# Patient Record
Sex: Male | Born: 1965 | Race: Asian | Hispanic: No | Marital: Married | Smoking: Former smoker
Health system: Southern US, Community
[De-identification: ages and names within clinical notes are randomized; demographics above are authoritative.]

## PROBLEM LIST (undated history)

## (undated) DIAGNOSIS — I10 Essential (primary) hypertension: Secondary | ICD-10-CM

## (undated) DIAGNOSIS — E785 Hyperlipidemia, unspecified: Secondary | ICD-10-CM

## (undated) DIAGNOSIS — F1010 Alcohol abuse, uncomplicated: Secondary | ICD-10-CM

## (undated) DIAGNOSIS — M109 Gout, unspecified: Secondary | ICD-10-CM

## (undated) HISTORY — DX: Alcohol abuse, uncomplicated: F10.10

## (undated) HISTORY — DX: Hyperlipidemia, unspecified: E78.5

## (undated) HISTORY — DX: Essential (primary) hypertension: I10

## (undated) HISTORY — DX: Gout, unspecified: M10.9

---

## 2015-05-13 ENCOUNTER — Ambulatory Visit (INDEPENDENT_AMBULATORY_CARE_PROVIDER_SITE_OTHER): Payer: BLUE CROSS/BLUE SHIELD | Admitting: Physician Assistant

## 2015-05-13 VITALS — BP 124/78 | HR 85 | Temp 98.5°F | Resp 17 | Ht 66.0 in | Wt 158.0 lb

## 2015-05-13 DIAGNOSIS — Z1211 Encounter for screening for malignant neoplasm of colon: Secondary | ICD-10-CM

## 2015-05-13 DIAGNOSIS — Z130 Encounter for screening for diseases of the blood and blood-forming organs and certain disorders involving the immune mechanism: Secondary | ICD-10-CM | POA: Diagnosis not present

## 2015-05-13 DIAGNOSIS — F1010 Alcohol abuse, uncomplicated: Secondary | ICD-10-CM | POA: Diagnosis not present

## 2015-05-13 DIAGNOSIS — Z1159 Encounter for screening for other viral diseases: Secondary | ICD-10-CM | POA: Diagnosis not present

## 2015-05-13 DIAGNOSIS — Z114 Encounter for screening for human immunodeficiency virus [HIV]: Secondary | ICD-10-CM

## 2015-05-13 DIAGNOSIS — E7800 Pure hypercholesterolemia, unspecified: Secondary | ICD-10-CM

## 2015-05-13 DIAGNOSIS — Z23 Encounter for immunization: Secondary | ICD-10-CM | POA: Diagnosis not present

## 2015-05-13 DIAGNOSIS — Z13228 Encounter for screening for other metabolic disorders: Secondary | ICD-10-CM

## 2015-05-13 DIAGNOSIS — Z13220 Encounter for screening for lipoid disorders: Secondary | ICD-10-CM

## 2015-05-13 DIAGNOSIS — Z125 Encounter for screening for malignant neoplasm of prostate: Secondary | ICD-10-CM | POA: Diagnosis not present

## 2015-05-13 DIAGNOSIS — Z0000 Encounter for general adult medical examination without abnormal findings: Secondary | ICD-10-CM

## 2015-05-13 DIAGNOSIS — Z1329 Encounter for screening for other suspected endocrine disorder: Secondary | ICD-10-CM

## 2015-05-13 LAB — CBC WITH DIFFERENTIAL/PLATELET
BASOS ABS: 0.1 10*3/uL (ref 0.0–0.1)
Basophils Relative: 1 % (ref 0–1)
EOS ABS: 0.1 10*3/uL (ref 0.0–0.7)
EOS PCT: 1 % (ref 0–5)
HCT: 48.1 % (ref 39.0–52.0)
Hemoglobin: 16.7 g/dL (ref 13.0–17.0)
LYMPHS ABS: 1.9 10*3/uL (ref 0.7–4.0)
Lymphocytes Relative: 33 % (ref 12–46)
MCH: 31.9 pg (ref 26.0–34.0)
MCHC: 34.7 g/dL (ref 30.0–36.0)
MCV: 91.8 fL (ref 78.0–100.0)
MPV: 10.2 fL (ref 8.6–12.4)
Monocytes Absolute: 0.6 10*3/uL (ref 0.1–1.0)
Monocytes Relative: 11 % (ref 3–12)
Neutro Abs: 3.1 10*3/uL (ref 1.7–7.7)
Neutrophils Relative %: 54 % (ref 43–77)
PLATELETS: 291 10*3/uL (ref 150–400)
RBC: 5.24 MIL/uL (ref 4.22–5.81)
RDW: 13.2 % (ref 11.5–15.5)
WBC: 5.7 10*3/uL (ref 4.0–10.5)

## 2015-05-13 LAB — COMPLETE METABOLIC PANEL WITH GFR
ALBUMIN: 4.5 g/dL (ref 3.6–5.1)
ALK PHOS: 62 U/L (ref 40–115)
ALT: 60 U/L — AB (ref 9–46)
AST: 53 U/L — ABNORMAL HIGH (ref 10–35)
BILIRUBIN TOTAL: 0.6 mg/dL (ref 0.2–1.2)
BUN: 15 mg/dL (ref 7–25)
CALCIUM: 10 mg/dL (ref 8.6–10.3)
CO2: 27 mmol/L (ref 20–31)
CREATININE: 0.79 mg/dL (ref 0.70–1.33)
Chloride: 98 mmol/L (ref 98–110)
Glucose, Bld: 128 mg/dL — ABNORMAL HIGH (ref 65–99)
Potassium: 4.7 mmol/L (ref 3.5–5.3)
Sodium: 137 mmol/L (ref 135–146)
TOTAL PROTEIN: 9.2 g/dL — AB (ref 6.1–8.1)

## 2015-05-13 LAB — LIPID PANEL
CHOLESTEROL: 301 mg/dL — AB (ref 125–200)
HDL: 70 mg/dL (ref >=40)
LDL CALC: 179 mg/dL — AB (ref <130)
TRIGLYCERIDES: 258 mg/dL — AB (ref <150)
Total CHOL/HDL Ratio: 4.3 Ratio (ref <=5.0)
VLDL: 52 mg/dL — ABNORMAL HIGH (ref <30)

## 2015-05-13 LAB — HEPATITIS C ANTIBODY: HCV AB: NEGATIVE

## 2015-05-13 LAB — TSH: TSH: 2.86 mIU/L (ref 0.40–4.50)

## 2015-05-13 LAB — HIV ANTIBODY (ROUTINE TESTING W REFLEX): HIV 1&2 Ab, 4th Generation: NONREACTIVE

## 2015-05-13 NOTE — Progress Notes (Signed)
Nathan Stout  MRN: [id number] DOB: 09/09/1965  Subjective:  Pt presents to clinic for a CPE. He is having no problems.  Drinks ETOH - he does not think he has a problem.  Only drinks after work.  Wife is not worried about his use (she is in the room with him)  Last dental exam: appt 4/17 Last vision exam: never Last colonoscopy: never had one done - interested Vaccinations      Tetanus      Flu vaccine - never gets them    Patient Active Problem List   Diagnosis Date Noted  . ETOH abuse 05/13/2015    No current outpatient prescriptions on file prior to visit.   No current facility-administered medications on file prior to visit.    No Known Allergies  Social History   Social History  . Marital Status: Married    Spouse Name: N/A  . Number of Children: N/A  . Years of Education: N/A   Occupational History  . Curatormechanic    Social History Main Topics  . Smoking status: Never Smoker   . Smokeless tobacco: None  . Alcohol Use: No  . Drug Use: Yes     Comment: 10 beers daily - start drinking after work  . Sexual Activity: Yes   Other Topics Concern  . None   Social History Narrative   Married - [name]   2 daughter   Work - Nurse, learning disabilityauto-repair   Exercise - none outside of work   From [place] - here since 1987    History reviewed. No pertinent past surgical history.  Family History  Problem Relation Age of Onset  . Stroke Father   . Hypertension Brother     Review of Systems  Constitutional: Negative.   HENT: Negative.   Eyes: Negative.   Respiratory: Negative.   Cardiovascular: Negative.   Gastrointestinal: Negative.   Endocrine: Negative.   Genitourinary: Negative.   Musculoskeletal: Negative.   Skin: Negative.   Allergic/Immunologic: Negative.   Neurological: Negative.   Hematological: Negative.   Psychiatric/Behavioral: Negative.    Objective:  BP 124/78 mmHg  Pulse 85  Temp(Src) 98.5 F (36.9 C) (Oral)  Resp 17  Ht 5\' 6"  (1.676 m)  Wt 158 lb  (71.668 kg)  BMI 25.51 kg/m2  SpO2 98%  Physical Exam  Constitutional: He is oriented to person, place, and time and well-developed, well-nourished, and in no distress.  HENT:  Head: Normocephalic and atraumatic.  Right Ear: Hearing, tympanic membrane, external ear and ear canal normal.  Left Ear: Hearing, tympanic membrane, external ear and ear canal normal.  Nose: Nose normal.  Mouth/Throat: Uvula is midline, oropharynx is clear and moist and mucous membranes are normal.  Eyes: Conjunctivae and EOM are normal. Pupils are equal, round, and reactive to light.  Neck: Trachea normal and normal range of motion. Neck supple. No thyroid mass and no thyromegaly present.  Cardiovascular: Normal rate, regular rhythm and normal heart sounds.   No murmur heard. Pulmonary/Chest: Effort normal and breath sounds normal.  Abdominal: Soft. Bowel sounds are normal.  Genitourinary: Prostate normal, testes/scrotum normal and penis normal.  Musculoskeletal: Normal range of motion.  Neurological: He is alert and oriented to person, place, and time. Gait normal.  Skin: Skin is warm and dry.  Psychiatric: Mood, memory, affect and judgment normal.    Visual Acuity Screening   Right eye Left eye Both eyes  Without correction: 20/25 20/20 20/30   With correction:  Assessment and Plan :  Annual physical exam - anticipatory guidance  Screening, deficiency anemia, iron - Plan: CBC with Differential/Platelet  Screening for metabolic disorder - Plan: COMPLETE METABOLIC PANEL WITH GFR  Screening cholesterol level - Plan: Lipid panel  Screening for thyroid disorder - Plan: TSH  Screening for HIV (human immunodeficiency virus) - Plan: HIV antibody  Need for hepatitis C screening test - Plan: Hepatitis C antibody  Need for Tdap vaccination - Plan: Tdap vaccine greater than or equal to 7yo IM  ETOH abuse - pt uses significant ETOH daily - we talked about possible decrease - patient does not think  that he has a problem currently  Screen for colon cancer - Plan: Ambulatory referral to Gastroenterology, IFOBT POC (occult bld, rslt in office)  Screening for prostate cancer - Plan: PSA  Benny Lennert PA-C  [facility] 05/13/2015 10:10 AM

## 2015-05-13 NOTE — Patient Instructions (Signed)
Keeping you healthy  Get these tests  Blood pressure- Have your blood pressure checked once a year by your healthcare provider.  Normal blood pressure is 120/80  Weight- Have your body mass index (BMI) calculated to screen for obesity.  BMI is a measure of body fat based on height and weight. You can also calculate your own BMI at [email or web address].com/bmi/.  Cholesterol- Have your cholesterol checked every year.  Diabetes- Have your blood sugar checked regularly if you have high blood pressure, high cholesterol, have a family history of diabetes or if you are overweight.  Screening for Colon Cancer- Colonoscopy starting at age 50.  Screening may begin sooner depending on your family history and other health conditions. Follow up colonoscopy as directed by your Gastroenterologist.  Screening for Prostate Cancer- Both blood work (PSA) and a rectal exam help screen for Prostate Cancer.  Screening begins at age 40 with African-American men and at age 50 with Caucasian men.  Screening may begin sooner depending on your family history.  Take these medicines  Aspirin- One aspirin daily can help prevent Heart disease and Stroke.  Flu shot- Every fall.  Tetanus- Every 10 years.  Zostavax- Once after the age of 60 to prevent Shingles.  Pneumonia shot- Once after the age of 65; if you are younger than 65, ask your healthcare provider if you need a Pneumonia shot.  Take these steps  Don't smoke- If you do smoke, talk to your doctor about quitting.  For tips on how to quit, go to www.smokefree.gov or call 1-800-QUIT-NOW.  Be physically active- Exercise 5 days a week for at least 30 minutes.  If you are not already physically active start slow and gradually work up to 30 minutes of moderate physical activity.  Examples of moderate activity include walking briskly, mowing the yard, dancing, swimming, bicycling, etc.  Eat a healthy diet- Eat a variety of healthy food such as fruits, vegetables, low  fat milk, low fat cheese, yogurt, lean meant, poultry, fish, beans, tofu, etc. For more information go to www.thenutritionsource.org  Drink alcohol in moderation- Limit alcohol intake to less than two drinks a day. Never drink and drive.  Dentist- Brush and floss twice daily; visit your dentist twice a year.  Depression- Your emotional health is as important as your physical health. If you're feeling down, or losing interest in things you would normally enjoy please talk to your healthcare provider.  Eye exam- Visit your eye doctor every year.  Safe sex- If you may be exposed to a sexually transmitted infection, use a condom.  Seat belts- Seat belts can save your life; always wear one.  Smoke/Carbon Monoxide detectors- These detectors need to be installed on the appropriate level of your home.  Replace batteries at least once a year.  Skin cancer- When out in the sun, cover up and use sunscreen 15 SPF or higher.  Violence- If anyone is threatening you, please tell your healthcare provider.  Living Will/ Health care power of attorney- Speak with your healthcare provider and family.    IF you received an x-ray today, you will receive an invoice from [facility]. Please contact [facility] at [phone] with questions or concerns regarding your invoice.   IF you received labwork today, you will receive an invoice from [facility]. Please contact [facility] at [phone] with questions or concerns regarding your invoice.   Our billing staff will not be able to assist you with questions regarding bills from these companies.    You will be contacted with the lab results as soon as they are available. The fastest way to get your results is to activate your My Chart account. Instructions are located on the last page of this paperwork. If you have not heard from us regarding the results in 2 weeks, please contact this office.      

## 2015-05-14 LAB — PSA: PSA: 1.11 ng/mL (ref <=4.00)

## 2015-05-15 LAB — IFOBT (OCCULT BLOOD): IFOBT: POSITIVE

## 2015-05-17 ENCOUNTER — Encounter: Payer: Self-pay | Admitting: Physician Assistant

## 2015-05-17 DIAGNOSIS — E7800 Pure hypercholesterolemia, unspecified: Secondary | ICD-10-CM | POA: Insufficient documentation

## 2015-05-17 MED ORDER — ATORVASTATIN CALCIUM 40 MG PO TABS: 40.0000 mg | ORAL_TABLET | Freq: Every day | ORAL | Status: DC

## 2015-05-17 NOTE — Addendum Note (Signed)
Addended by: Morrell RiddleWEBER, Ardean Melroy L on: 05/17/2015 03:02 PM   Modules accepted: Orders, SmartSet

## 2015-07-19 ENCOUNTER — Other Ambulatory Visit: Payer: Self-pay | Admitting: Physician Assistant

## 2015-08-13 ENCOUNTER — Ambulatory Visit (INDEPENDENT_AMBULATORY_CARE_PROVIDER_SITE_OTHER): Payer: BLUE CROSS/BLUE SHIELD

## 2015-08-13 ENCOUNTER — Ambulatory Visit (INDEPENDENT_AMBULATORY_CARE_PROVIDER_SITE_OTHER): Payer: BLUE CROSS/BLUE SHIELD | Admitting: Family Medicine

## 2015-08-13 VITALS — BP 154/100 | HR 80 | Temp 98.4°F | Resp 18 | Ht 66.0 in | Wt 154.0 lb

## 2015-08-13 DIAGNOSIS — I10 Essential (primary) hypertension: Secondary | ICD-10-CM

## 2015-08-13 DIAGNOSIS — F1010 Alcohol abuse, uncomplicated: Secondary | ICD-10-CM | POA: Diagnosis not present

## 2015-08-13 DIAGNOSIS — R0600 Dyspnea, unspecified: Secondary | ICD-10-CM

## 2015-08-13 DIAGNOSIS — E785 Hyperlipidemia, unspecified: Secondary | ICD-10-CM | POA: Diagnosis not present

## 2015-08-13 DIAGNOSIS — R7989 Other specified abnormal findings of blood chemistry: Secondary | ICD-10-CM

## 2015-08-13 DIAGNOSIS — R945 Abnormal results of liver function studies: Secondary | ICD-10-CM

## 2015-08-13 MED ORDER — METOPROLOL TARTRATE 25 MG PO TABS: 12.5000 mg | ORAL_TABLET | Freq: Two times a day (BID) | ORAL | Status: DC

## 2015-08-13 NOTE — Progress Notes (Addendum)
Subjective:  By signing my name below, I, Raven Small, attest that this documentation has been prepared under the direction and in the presence of Meredith Staggers, MD.  Electronically Signed: Andrew Au, ED Scribe. 08/13/2015. 6:06 PM.    Patient ID: Nathan Stout, male    DOB: [birth year], 50 y.o.   MRN: [id number]  HPI Chief Complaint  Patient presents with  . Hypertension  . Shortness of Breath    YESTERDAY    HPI Comments: Nathan Stout is a 50 y.o. male who presents [facility] complaining of hypertension. Hx of HTN and alcohol abuse per problem list. Last seen in March for a physical. BP at that time 124/78. Counseled on over use at that time but per note, pt did not feel he had a problem.   Pt states his BP was 191 when seen at [facility] 3 weeks ago.  Pt states while drinking at his brothers house last night he developed a HA and SOB, lasting 1 hour. His BP at that time was 225/129.  Later that evening, when he got home, he developed night sweats but no CP or tightness. He drank 3.5 beers last night. Pt still drinks 10- 12 beers 7 days a week but will occasionally cut back to 5 days. He plans to cut back to 10 beers 4 days a week.  He notes also having palpitation with drinking occasionally. He denies trouble breathing at this time. He denies hx of MI. Pt has never had a stress test. Pt denies leg swelling.  Pt does not feel he needs assistance cutting back on alcohol.   His father passed away from a stroke. His older brother with hx of HTN and HLD.   Patient Active Problem List   Diagnosis Date Noted  . Elevated cholesterol 05/17/2015  . ETOH abuse 05/13/2015   No past medical history on file. No past surgical history on file. No Known Allergies Prior to Admission medications   Medication Sig Start Date End Date Taking? Authorizing Provider  atorvastatin (LIPITOR) 40 MG tablet TAKE 1 TABLET(40 MG) BY MOUTH DAILY 07/19/15   Morrell Riddle, PA-C   Social History    Social History  . Marital Status: Married    Spouse Name: N/A  . Number of Children: N/A  . Years of Education: N/A   Occupational History  . Curator    Social History Main Topics  . Smoking status: Never Smoker   . Smokeless tobacco: Not on file  . Alcohol Use: No  . Drug Use: Yes     Comment: 10 beers daily - start drinking after work  . Sexual Activity: Yes   Other Topics Concern  . Not on file   Social History Narrative   Married - [name]   2 daughter   Work - Nurse, learning disability   Exercise - none outside of work   From [place] - here since 1987   Review of Systems  Constitutional: Positive for diaphoresis. Negative for fatigue and unexpected weight change.  Eyes: Negative for visual disturbance.  Respiratory: Positive for shortness of breath. Negative for cough and chest tightness.   Cardiovascular: Positive for palpitations. Negative for chest pain and leg swelling.  Gastrointestinal: Negative for abdominal pain and blood in stool.  Neurological: Positive for headaches. Negative for dizziness and light-headedness.   Objective:   Physical Exam  Constitutional: He is oriented to person, place, and time. He appears well-developed and well-nourished.  HENT:  Head:  Normocephalic and atraumatic.  Eyes: EOM are normal. Pupils are equal, round, and reactive to light.  Neck: No JVD present. Carotid bruit is not present.  Cardiovascular: Normal rate, regular rhythm and normal heart sounds.   No murmur heard. Pulmonary/Chest: Effort normal and breath sounds normal. He has no rales.  Abdominal: Soft. He exhibits no mass.  Musculoskeletal: He exhibits no edema.  Neurological: He is alert and oriented to person, place, and time.  Skin: Skin is warm and dry.  Psychiatric: He has a normal mood and affect.  Vitals reviewed.  Filed Vitals:   08/13/15 1733 08/13/15 1805  BP: 198/88 154/100  Pulse: 80   Temp: 98.4 F (36.9 C)   TempSrc: Oral   Resp: 18   Height: 5\' 6"  (1.676  m)   Weight: 154 lb (69.854 kg)   SpO2: 99%    Dg Chest 2 View  08/13/2015  CLINICAL DATA:  Dyspnea EXAM: CHEST  2 VIEW COMPARISON:  None. FINDINGS: The heart size and mediastinal contours are within normal limits. Both lungs are clear. The visualized skeletal structures are unremarkable. IMPRESSION: No active cardiopulmonary disease. Electronically Signed   By: Kennith Center M.D.   On: 08/13/2015 18:55   EKG - sinus rhythm, q wave in lead III. Non specific ST waves VII and VIII but no acute findings.   Assessment & Plan:    Rannie Craney is a 50 y.o. male Hyperlipidemia - Plan: EKG 12-Lead, Ambulatory referral to Cardiology Dyspnea - Plan: EKG 12-Lead, Ambulatory referral to Cardiology, DG Chest 2 View, COMPLETE METABOLIC PANEL WITH GFR, Lipase Essential hypertension - Plan: EKG 12-Lead, Ambulatory referral to Cardiology, DG Chest 2 View, metoprolol tartrate (LOPRESSOR) 25 MG tablet  - Likely combination of alcohol abuse and sodium intake. However has had multiple elevated readings including outside medical practice. We'll start Lopressor 12.5 mg twice a day for now, discussed alcohol decrease, and decreased sodium in diet. With episode of dyspnea last night, we'll have evaluated through cardiology, no acute findings on EKG in office. ER/RTC precautions discussed.  Alcohol abuse - Plan: COMPLETE METABOLIC PANEL WITH GFR, Lipase Elevated LFTs - Plan: COMPLETE METABOLIC PANEL WITH GFR, Lipase  - Abdomen nontender, less likely pancreatitis, as asymptomatic currently. Will check lipase, repeat CMP given previous elevated LFTs, again likely due to alcohol intake. Advised to cut back as above, and let me know if assistance needed.  Recheck 2 weeks. Sooner if worse.   Meds ordered this encounter  Medications  . metoprolol tartrate (LOPRESSOR) 25 MG tablet    Sig: Take 0.5 tablets (12.5 mg total) by mouth 2 (two) times daily.    Dispense:  30 tablet    Refill:  1   Patient Instructions        IF you received an x-ray today, you will receive an invoice from [facility]. Please contact [facility] at [phone] with questions or concerns regarding your invoice.   IF you received labwork today, you will receive an invoice from [facility]. Please contact [facility] at [phone] with questions or concerns regarding your invoice.   Our billing staff will not be able to assist you with questions regarding bills from these companies.  You will be contacted with the lab results as soon as they are available. The fastest way to get your results is to activate your My Chart account. Instructions are located on the last page of this paperwork. If you have not heard from [place] regarding the results in 2  weeks, please contact this office.    Cut back on alcohol as we discussed, as this is likely the cause of your elevated blood pressure. This also appears to be affecting other parts of your health including your liver tests that were elevated slightly when Maralyn Sago did your physical.  I will recheck your liver tests, check a pancreas test, and some electrolytes due to your elevated blood pressure. If you need assistance in cutting back on alcohol or feel that you having difficulty with cutting back, please let me know and I can provide those resources. Also cut back on sodium or salt in the diet this can also elevated blood pressure. See information on this below.  Start metoprolol twice per day, and follow-up with cardiologist for further testing from your symptoms last night. If you do have any further episodes of shortness of breath, or any chest pain or tightness, go to the emergency room for evaluation. If any abdominal pain - return here or emergency room.   Recheck with me in the next 2 weeks, sooner if worse. Keep a record of your blood pressures outside of the office and bring them to the next office visit.   T?ng huy?t  p (Hypertension) T?ng huy?t p, th??ng ???c g?i l huy?t p cao, l khi l?c b?m mu qua ??ng m?ch c?a qu v? qu m?nh. ??ng m?ch c?a qu v? l cc m?ch mu mang mu t? tim ?i kh?p c? th? c?a qu v?. K?t qu? ?o huy?t p c m?t con s? cao v m?t con s? th?p, ch?ng h?n nh? 110/72. Con s? cao (tm thu) l p l?c bn trong ??ng m?ch khi tim qu v? b?m. Con s? th?p (tm tr??ng) l p l?c bn trong ??ng m?ch khi tim qu v? gin ra. Huy?t p l t??ng c?n cho qu v? ph?i d??i 120/80. Ch?ng t?ng huy?t p bu?c tim qu v? ph?i lm vi?c v?t v? h?n ?? b?m mu. ??ng m?ch c?a qu v? c th? b? h?p ho?c c?ng. Huy?t p cao khng ???c ?i?u tr? ho?c khng ???c ki?m sot c th? d?n t?i nh?i mu c? tim, ??t qu?, b?nh th?n v nh?ng v?n ?? khc. CC Y?U T? NGUY C? M?t s? y?u t? nguy c? d?n ??n huy?t p cao c th? ki?m sot ???c. M?t s? y?u t? khc th khng.  Nh?ng y?u t? nguy c? khng th? ki?m sot ???c bao g?m:   Ch?ng t?c. Qu v? c nguy c? cao h?n n?u qu v? l ng??i M? g?c Phi.  ?? tu?i. Nguy c? t?ng ln theo ?? tu?i.  Gi?i tnh. Nam gi?i c nguy c? cao h?n ph? n? tr??c tu?i 45. Sau tu?i 64, ph? n? c nguy c? cao h?n nam gi?i. Nh?ng y?u t? nguy c? c th? ki?m sot ???c bao g?m:  Khng t?p th? d?c ho?c cc ho?t ??ng th? ch?t ??y ??Marland Kitchen  Th?a cn.  ?n qu nhi?u ch?t bo, ???ng, ca-lo, ho?c mu?i.  U?ng qu nhi?u r??u. D?U HI?U V TRI?U CH?NG T?ng huy?t p th??ng khng gy ra d?u hi?u ho?c tri?u ch?ng. Huy?t p r?t cao (c?n cao huy?t p) c th? gy ?au ??u, lo l?ng, kh th? v ch?y mu cam. CH?N ?ON ?? ki?m tra xem qu v? c t?ng huy?t p khng, chuyn gia ch?m [place] s?c kh?e c?a qu v? s? ?o huy?t p trong khi qu v? ng?i ??t tay ? m?c ngang v?i tim. Huy?t p c?n ???c ?o t nh?t hai l?n  trn cng m?t cnh tay. M?t s? tnh tr?ng nh?t ??nh c th? lm cho huy?t p khc nhau gi?a tay ph?i v tay tri c?a qu v?. K?t qu? ?o huy?t p cao h?n bnh th??ng ? m?t th?i ?i?m no ? khng c ngh?a l qu v? c?n ?i?u tr?Marland Kitchen N?u  khng r li?u qu v? c huy?t p cao hay khng, qu v? c th? ???c ?? ngh? tr? l?i vo m?t ngy khc ?? ki?m tra l?i huy?t p. Ho?c qu v? c th? ???c yu c?u theo di huy?t p ? nh trong 1 tu?n ho?c h?n. ?I?U TR? ?i?u tr? huy?t p cao gao g?m thay ??i l?i s?ng v c th? ph?i dng thu?c. C m?t l?i s?ng lnh m?nh c th? gip lm gi?m huy?t p cao. Qu v? c th? c?n thay ??i m?t s? thi quen. Thay ??i l?i s?ng c th? bao g?m:  Th?c hi?n ch? ?? ?n DASH. Ch? ?? ?n ny c nhi?u tri cy, rau v ng? c?c nguyn h?t. C t mu?i, th?t ??, v t b? sung ???ng.  Duy tr l??ng mu?i tiu th? d??i 2.300 mg m?i ngy.  T?p aerobic t nh?t 30-45 pht t nh?t 4 l?n m?i tu?n.  Gi?m cn n?u c?n thi?t.  Khng ht thu?c.  H?n ch? ?? u?ng c c?n.  H?c cc cch gi?m c?ng th?ng. Chuyn gia ch?m [place] s?c kh?e c th? k ??n thu?c n?u thay ??i l?i s?ng khng ?? ?? ??a huy?t p v? m?c c th? ki?m sot ???c v n?u m?t trong nh?ng ?i?u sau l ?ng:  Qu v? t? 18-59 tu?i v huy?t p tm thu c?a qu v? trn 140.  Qu v? t? 60 tu?i tr? ln v huy?t p tm thu c?a qu v? trn 150.  Huy?t p tm tr??ng c?a qu v? trn 90.  Qu v? b? ti?u ???ng v huy?t p tm thu c?a qu v? trn 140 ho?c huy?t p tm tr??ng c?a qu v? trn 90.  Qu v? b? b?nh th?n v huy?t p qu v? trn 140/90.  Qu v? b? b?nh tim v huy?t p qu v? trn 140/90. Huy?t p m?c tiu c nhn c?a qu v? c th? khc nhau ty thu?c v tnh tr?ng b?nh l, tu?i v cc nhn t? khc. H??NG D?N CH?M [place] T?I NH  Ki?m tra l?i huy?t p c?a qu v? theo ch? d?n c?a chuyn gia ch?m [place] s?c kh?e.  Ch? s? d?ng thu?c theo ch? d?n c?a chuyn gia ch?m [place] s?c kh?e. Lm theo ch? d?n m?t cch c?n th?n. Thu?c ?i?u tr? huy?t p ph?i ???c dng theo ??n ? k. Thu?c c?ng s? khng c tc d?ng khi qu v? b? li?u. Vi?c b? li?u thu?c c?ng lm qu v? c nguy c? pht sinh v?n ??Imagene Sheller ht thu?c.  Theo di huy?t p c?a qu v? ? nh theo ch? d?n c?a chuyn gia ch?m [place] s?c  kh?e. ?I KHM N?U:   Qu v? ngh? qu v? c ph?n ?ng v?i thu?c ?ang dng.  Qu v? b? ?au ??u ho?c c?m th?y chng m?t ti di?n.  Qu v? b? s?ng ph ? m?t c chn.  Qu v? c v?n ?? v? th? l?c. NGAY L?P T?C ?I KHM N?U:  Qu v? b? ?au ??u n?ng ho?c l l?n.  Qu v? b? y?u b?t th??ng, t b, ho?c c?m th?y nh? ng?t x?u.  Qu v? b? ?au ng?c ho?c ?au b?ng r?t nhi?u.  Ladell Heads  v? nn nhi?u l?n.  Qu v? b? kh th?. ??M B?O QU V?:   Hi?u r cc h??ng d?n ny.  S? theo di tnh tr?ng c?a mnh.  S? yu c?u tr? gip ngay l?p t?c n?u qu v? c?m th?y khng kh?e ho?c th?y tr?m tr?ng h?n.   Thng tin ny khng nh?m m?c ?ch thay th? cho l?i khuyn m chuyn gia ch?m [place] s?c kh?e ni v?i qu v?. Hy b?o ??m qu v? ph?i th?o lu?n b?t k? v?n ?? g m qu v? c v?i chuyn gia ch?m [place] s?c kh?e c?a qu v?.   Document Released: 02/17/2005 Document Revised: 11/08/2014 Elsevier Interactive Patient Education 2016 [employer]   Kh th? (Shortness of Breath) Kh th? c ngh?a l qu v? g?p kh kh?n khi th?. ?i?u ? c?ng c th? c ngh?a l qu v? c m?t v?n ?? b?nh l. Qu v? nn ?i khm ngay l?p t?c khi b? kh th?. NGUYN NHN   Khng ?? oxy trong khng kh ch?ng h?n nh? do ?? cao ho?c trong phng ??y [place].  M?t s? b?nh ph?i nh?t ??nh, nhi?m trng, ho?c cc v?n ?? khc.  B?nh tim ho?c cc tnh tr?ng b?nh l nh? c?n ?au th?t ng?c ho?c suy tim.  S? l??ng h?ng c?u th?p (thi?u mu).  Th? tr?ng km, ?i?u ny c th? gy kh th? khi qu v? t?p th? d?c.  Ch?n th??ng ho?c c?ng ng?c ho?c l?ng.  Th?a cn.  Ht thu?c l.  Lo l?ng, ?i?u ny c th? khi?n cho qu v? c?m th?y nh? khng c ?? khng kh. CH?N ?ON  V?n ?? s?c kh?e nghim tr?ng th??ng c th? ???c pht hi?n trong khi khm th?c th?. Cc xt nghi?m c?ng c th? ???c th?c hi?n ?? xc ??nh t?i sao qu v? b? kh th?. Cc xt nghi?m c th? bao g?m:  Ch?p X quang ph?i.  Ki?m tra ch?c n?ng ph?i.  Xt nghi?m mu.  ?i?n tm ?? (ECG).  ?i?n tm  ?? l?u ??ng. ?i?n tm ?? l?u ??ng ghi l?i ki?u nh?p tim c?a qu v? trong th?i gian 24 ti?ng.  Nghi?m php g?ng s?c.  Siu m tim qua thnh ng?c (TTE). Trong siu m tim, sng m thanh ???c s? d?ng ?? ?nh gi cch mu l?u thng qua tim.  Siu m tim qua th?c qu?n (TEE).  Qut hnh ?nh. Chuyn gia ch?m [place] s?c kh?e c th? khng xc ??nh ???c nguyn nhn gy kh th? sau khi th?m khm. Trong tr??ng h?p ny, ?i?u quan tr?ng l c?n g?p chuyn gia ch?m [place] s?c kh?e ?? th?m khm l?i theo ch? d?n.  ?I?U TR?  ?i?u tr? kh th? ph? thu?c vo nguyn nhn gy ra cc tri?u ch?ng v c th? khc nhau r?t nhi?u. H??NG D?N CH?M [place] T?I NH   Khng ht thu?c. Ht thu?c l l m?t nguyn nhn ph? bi?n gy kh th?. N?u qu v? ht thu?c, hy nh? gip ?? ?? b? thu?c.  Trnh ? g?n ch? c ha ch?t ho?c nh?ng th? c th? ?nh h??ng ??n kh? n?ng th? c?a qu v?, ch?ng h?n nh? h?i s?n v b?i.  Ngh? ng?i khi c?n. T? t? b?t ??u l?i cc ho?t ??ng bnh th??ng c?a qu v?.  N?u ???c k ??n thu?c, hy s? d?ng thu?c theo ch? d?n trong ton b? th?i gian ???c ch? ??nh. ?i?u ny bao g?m c? oxy v b?t c? lo?i thu?c ht no.  Tun th? m?i  cu?c h?n khm l?i theo ch? d?n c?a chuyn gia ch?m [place] s?c kh?e. ?I KHM N?U:   Tnh tr?ng khng c?i thi?n trong th?i gian d? ki?n.  Qu v? g?p kh kh?n trong vi?c th?c hi?n cc ho?t ??ng bnh th??ng c?a mnh ngay c? sau khi ngh? ng?i.  Qu v? c b?t k? tri?u ch?ng m?i no. NGAY L?P T?C ?I KHM N?U:   Tnh tr?ng kh th? tr? nn t? h?n.  Qu v? c?m th?y chng m?t, ng?t ho?c b? ho khng ki?m sot ???c b?ng thu?c.  Qu v? b?t ??u ho ra mu.  Qu v? b? ?au khi th?.  Qu v? b? ?au ng?c, cnh tay, vai ho?c b?ng.  Qu v? b? s?t.  Qu v? khng th? ?i b? ln c?u thang ho?c t?p luy?n theo cch qu v? th??ng c th? lm. ??M B?O QU V?:  Hi?u r cc h??ng d?n ny.  S? theo di tnh tr?ng c?a mnh.  S? yu c?u tr? gip ngay l?p t?c n?u qu v? c?m th?y khng kh?e ho?c th?y tr?m tr?ng  h?n.   Thng tin ny khng nh?m m?c ?ch thay th? cho l?i khuyn m chuyn gia ch?m [place] s?c kh?e ni v?i qu v?. Hy b?o ??m qu v? ph?i th?o lu?n b?t k? v?n ?? g m qu v? c v?i chuyn gia ch?m [place] s?c kh?e c?a qu v?.   Document Released: 08/19/2011 Document Revised: 02/22/2013 [employer] Interactive Patient Education [employer]     I personally performed the services described in this documentation, which was scribed in my presence. The recorded information has been reviewed and considered, and addended by me as needed.   Signed,   Meredith Staggers, MD [facility].  08/13/2015 7:29 PM

## 2015-08-13 NOTE — Patient Instructions (Addendum)
IF you received an x-ray today, you will receive an invoice from [facility]. Please contact [facility] at [phone] with questions or concerns regarding your invoice.   IF you received labwork today, you will receive an invoice from [employer]. Please contact [facility] at [phone] with questions or concerns regarding your invoice.   Our billing staff will not be able to assist you with questions regarding bills from these companies.  You will be contacted with the lab results as soon as they are available. The fastest way to get your results is to activate your My Chart account. Instructions are located on the last page of this paperwork. If you have not heard from [place] regarding the results in 2 weeks, please contact this office.    Cut back on alcohol as we discussed, as this is likely the cause of your elevated blood pressure. This also appears to be affecting other parts of your health including your liver tests that were elevated slightly when Maralyn Sago did your physical.  I will recheck your liver tests, check a pancreas test, and some electrolytes due to your elevated blood pressure. If you need assistance in cutting back on alcohol or feel that you having difficulty with cutting back, please let me know and I can provide those resources. Also cut back on sodium or salt in the diet this can also elevated blood pressure. See information on this below.  Start metoprolol twice per day, and follow-up with cardiologist for further testing from your symptoms last night. If you do have any further episodes of shortness of breath, or any chest pain or tightness, go to the emergency room for evaluation. If any abdominal pain - return here or emergency room.   Recheck with me in the next 2 weeks, sooner if worse. Keep a record of your blood pressures outside of the office and bring them to the next office visit.   T?ng huy?t p (Hypertension) T?ng  huy?t p, th??ng ???c g?i l huy?t p cao, l khi l?c b?m mu qua ??ng m?ch c?a qu v? qu m?nh. ??ng m?ch c?a qu v? l cc m?ch mu mang mu t? tim ?i kh?p c? th? c?a qu v?. K?t qu? ?o huy?t p c m?t con s? cao v m?t con s? th?p, ch?ng h?n nh? 110/72. Con s? cao (tm thu) l p l?c bn trong ??ng m?ch khi tim qu v? b?m. Con s? th?p (tm tr??ng) l p l?c bn trong ??ng m?ch khi tim qu v? gin ra. Huy?t p l t??ng c?n cho qu v? ph?i d??i 120/80. Ch?ng t?ng huy?t p bu?c tim qu v? ph?i lm vi?c v?t v? h?n ?? b?m mu. ??ng m?ch c?a qu v? c th? b? h?p ho?c c?ng. Huy?t p cao khng ???c ?i?u tr? ho?c khng ???c ki?m sot c th? d?n t?i nh?i mu c? tim, ??t qu?, b?nh th?n v nh?ng v?n ?? khc. CC Y?U T? NGUY C? M?t s? y?u t? nguy c? d?n ??n huy?t p cao c th? ki?m sot ???c. M?t s? y?u t? khc th khng.  Nh?ng y?u t? nguy c? khng th? ki?m sot ???c bao g?m:   Ch?ng t?c. Qu v? c nguy c? cao h?n n?u qu v? l ng??i M? g?c Phi.  ?? tu?i. Nguy c? t?ng ln theo ?? tu?i.  Gi?i tnh. Nam gi?i c nguy c? cao h?n ph? n? tr??c tu?i 45. Sau tu?i 64, ph? n? c nguy c? cao h?n nam  gi?i. Nh?ng y?u t? nguy c? c th? ki?m sot ???c bao g?m:  Khng t?p th? d?c ho?c cc ho?t ??ng th? ch?t ??y ??Marland Kitchen.  Th?a cn.  ?n qu nhi?u ch?t bo, ???ng, ca-lo, ho?c mu?i.  U?ng qu nhi?u r??u. D?U HI?U V TRI?U CH?NG T?ng huy?t p th??ng khng gy ra d?u hi?u ho?c tri?u ch?ng. Huy?t p r?t cao (c?n cao huy?t p) c th? gy ?au ??u, lo l?ng, kh th? v ch?y mu cam. CH?N ?ON ?? ki?m tra xem qu v? c t?ng huy?t p khng, chuyn gia ch?m [place] s?c kh?e c?a qu v? s? ?o huy?t p trong khi qu v? ng?i ??t tay ? m?c ngang v?i tim. Huy?t p c?n ???c ?o t nh?t hai l?n trn cng m?t cnh tay. M?t s? tnh tr?ng nh?t ??nh c th? lm cho huy?t p khc nhau gi?a tay ph?i v tay tri c?a qu v?. K?t qu? ?o huy?t p cao h?n bnh th??ng ? m?t th?i ?i?m no ? khng c ngh?a l qu v? c?n ?i?u tr?Marland Kitchen. N?u khng r li?u qu v? c huy?t  p cao hay khng, qu v? c th? ???c ?? ngh? tr? l?i vo m?t ngy khc ?? ki?m tra l?i huy?t p. Ho?c qu v? c th? ???c yu c?u theo di huy?t p ? nh trong 1 tu?n ho?c h?n. ?I?U TR? ?i?u tr? huy?t p cao gao g?m thay ??i l?i s?ng v c th? ph?i dng thu?c. C m?t l?i s?ng lnh m?nh c th? gip lm gi?m huy?t p cao. Qu v? c th? c?n thay ??i m?t s? thi quen. Thay ??i l?i s?ng c th? bao g?m:  Th?c hi?n ch? ?? ?n DASH. Ch? ?? ?n ny c nhi?u tri cy, rau v ng? c?c nguyn h?t. C t mu?i, th?t ??, v t b? sung ???ng.  Duy tr l??ng mu?i tiu th? d??i 2.300 mg m?i ngy.  T?p aerobic t nh?t 30-45 pht t nh?t 4 l?n m?i tu?n.  Gi?m cn n?u c?n thi?t.  Khng ht thu?c.  H?n ch? ?? u?ng c c?n.  H?c cc cch gi?m c?ng th?ng. Chuyn gia ch?m [place] s?c kh?e c th? k ??n thu?c n?u thay ??i l?i s?ng khng ?? ?? ??a huy?t p v? m?c c th? ki?m sot ???c v n?u m?t trong nh?ng ?i?u sau l ?ng:  Qu v? t? 18-59 tu?i v huy?t p tm thu c?a qu v? trn 140.  Qu v? t? 60 tu?i tr? ln v huy?t p tm thu c?a qu v? trn 150.  Huy?t p tm tr??ng c?a qu v? trn 90.  Qu v? b? ti?u ???ng v huy?t p tm thu c?a qu v? trn 140 ho?c huy?t p tm tr??ng c?a qu v? trn 90.  Qu v? b? b?nh th?n v huy?t p qu v? trn 140/90.  Qu v? b? b?nh tim v huy?t p qu v? trn 140/90. Huy?t p m?c tiu c nhn c?a qu v? c th? khc nhau ty thu?c v tnh tr?ng b?nh l, tu?i v cc nhn t? khc. H??NG D?N CH?M [place] T?I NH  Ki?m tra l?i huy?t p c?a qu v? theo ch? d?n c?a chuyn gia ch?m [place] s?c kh?e.  Ch? s? d?ng thu?c theo ch? d?n c?a chuyn gia ch?m  s?c kh?e. Lm theo ch? d?n m?t cch c?n th?n. Thu?c ?i?u tr? huy?t p ph?i ???c dng theo ??n ? k. Thu?c c?ng s? khng c tc d?ng khi qu v? b? li?u. Vi?c b? li?u thu?c  c?ng lm qu v? c nguy c? pht sinh v?n ??Imagene Sheller ht thu?c.  Theo di huy?t p c?a qu v? ? nh theo ch? d?n c?a chuyn gia ch?m [place] s?c kh?e. ?I KHM N?U:   Qu v?  ngh? qu v? c ph?n ?ng v?i thu?c ?ang dng.  Qu v? b? ?au ??u ho?c c?m th?y chng m?t ti di?n.  Qu v? b? s?ng ph ? m?t c chn.  Qu v? c v?n ?? v? th? l?c. NGAY L?P T?C ?I KHM N?U:  Qu v? b? ?au ??u n?ng ho?c l l?n.  Qu v? b? y?u b?t th??ng, t b, ho?c c?m th?y nh? ng?t x?u.  Qu v? b? ?au ng?c ho?c ?au b?ng r?t nhi?u.  Qu v? nn nhi?u l?n.  Qu v? b? kh th?. ??M B?O QU V?:   Hi?u r cc h??ng d?n ny.  S? theo di tnh tr?ng c?a mnh.  S? yu c?u tr? gip ngay l?p t?c n?u qu v? c?m th?y khng kh?e ho?c th?y tr?m tr?ng h?n.   Thng tin ny khng nh?m m?c ?ch thay th? cho l?i khuyn m chuyn gia ch?m [place] s?c kh?e ni v?i qu v?. Hy b?o ??m qu v? ph?i th?o lu?n b?t k? v?n ?? g m qu v? c v?i chuyn gia ch?m [place] s?c kh?e c?a qu v?.   Document Released: 02/17/2005 Document Revised: 11/08/2014 Elsevier Interactive Patient Education 2016 [employer].   Kh th? (Shortness of Breath) Kh th? c ngh?a l qu v? g?p kh kh?n khi th?. ?i?u ? c?ng c th? c ngh?a l qu v? c m?t v?n ?? b?nh l. Qu v? nn ?i khm ngay l?p t?c khi b? kh th?. NGUYN NHN   Khng ?? oxy trong khng kh ch?ng h?n nh? do ?? cao ho?c trong phng ??y [place].  M?t s? b?nh ph?i nh?t ??nh, nhi?m trng, ho?c cc v?n ?? khc.  B?nh tim ho?c cc tnh tr?ng b?nh l nh? c?n ?au th?t ng?c ho?c suy tim.  S? l??ng h?ng c?u th?p (thi?u mu).  Th? tr?ng km, ?i?u ny c th? gy kh th? khi qu v? t?p th? d?c.  Ch?n th??ng ho?c c?ng ng?c ho?c l?ng.  Th?a cn.  Ht thu?c l.  Lo l?ng, ?i?u ny c th? khi?n cho qu v? c?m th?y nh? khng c ?? khng kh. CH?N ?ON  V?n ?? s?c kh?e nghim tr?ng th??ng c th? ???c pht hi?n trong khi khm th?c th?. Cc xt nghi?m c?ng c th? ???c th?c hi?n ?? xc ??nh t?i sao qu v? b? kh th?. Cc xt nghi?m c th? bao g?m:  Ch?p X quang ph?i.  Ki?m tra ch?c n?ng ph?i.  Xt nghi?m mu.  ?i?n tm ?? (ECG).  ?i?n tm ?? l?u ??ng. ?i?n tm ?? l?u  ??ng ghi l?i ki?u nh?p tim c?a qu v? trong th?i gian 24 ti?ng.  Nghi?m php g?ng s?c.  Siu m tim qua thnh ng?c (TTE). Trong siu m tim, sng m thanh ???c s? d?ng ?? ?nh gi cch mu l?u thng qua tim.  Siu m tim qua th?c qu?n (TEE).  Qut hnh ?nh. Chuyn gia ch?m [place] s?c kh?e c th? khng xc ??nh ???c nguyn nhn gy kh th? sau khi th?m khm. Trong tr??ng h?p ny, ?i?u quan tr?ng l c?n g?p chuyn gia ch?m  s?c kh?e ?? th?m khm l?i theo ch? d?n.  ?I?U TR?  ?i?u tr? kh th? ph? thu?c vo nguyn nhn gy ra cc  tri?u ch?ng v c th? khc nhau r?t nhi?u. H??NG D?N CH?M [place] T?I NH   Khng ht thu?c. Ht thu?c l l m?t nguyn nhn ph? bi?n gy kh th?. N?u qu v? ht thu?c, hy nh? gip ?? ?? b? thu?c.  Trnh ? g?n ch? c ha ch?t ho?c nh?ng th? c th? ?nh h??ng ??n kh? n?ng th? c?a qu v?, ch?ng h?n nh? h?i s?n v b?i.  Ngh? ng?i khi c?n. T? t? b?t ??u l?i cc ho?t ??ng bnh th??ng c?a qu v?.  N?u ???c k ??n thu?c, hy s? d?ng thu?c theo ch? d?n trong ton b? th?i gian ???c ch? ??nh. ?i?u ny bao g?m c? oxy v b?t c? lo?i thu?c ht no.  Tun th? m?i cu?c h?n khm l?i theo ch? d?n c?a chuyn gia ch?m [place] s?c kh?e. ?I KHM N?U:   Tnh tr?ng khng c?i thi?n trong th?i gian d? ki?n.  Qu v? g?p kh kh?n trong vi?c th?c hi?n cc ho?t ??ng bnh th??ng c?a mnh ngay c? sau khi ngh? ng?i.  Qu v? c b?t k? tri?u ch?ng m?i no. NGAY L?P T?C ?I KHM N?U:   Tnh tr?ng kh th? tr? nn t? h?n.  Qu v? c?m th?y chng m?t, ng?t ho?c b? ho khng ki?m sot ???c b?ng thu?c.  Qu v? b?t ??u ho ra mu.  Qu v? b? ?au khi th?.  Qu v? b? ?au ng?c, cnh tay, vai ho?c b?ng.  Qu v? b? s?t.  Qu v? khng th? ?i b? ln c?u thang ho?c t?p luy?n theo cch qu v? th??ng c th? lm. ??M B?O QU V?:  Hi?u r cc h??ng d?n ny.  S? theo di tnh tr?ng c?a mnh.  S? yu c?u tr? gip ngay l?p t?c n?u qu v? c?m th?y khng kh?e ho?c th?y tr?m tr?ng h?n.   Thng tin ny khng nh?m  m?c ?ch thay th? cho l?i khuyn m chuyn gia ch?m [place] s?c kh?e ni v?i qu v?. Hy b?o ??m qu v? ph?i th?o lu?n b?t k? v?n ?? g m qu v? c v?i chuyn gia ch?m [place] s?c kh?e c?a qu v?.   Document Released: 08/19/2011 Document Revised: 02/22/2013 Elsevier Interactive Patient Education [employer]

## 2015-08-14 LAB — COMPLETE METABOLIC PANEL WITH GFR
ALT: 85 U/L — ABNORMAL HIGH (ref 9–46)
AST: 76 U/L — ABNORMAL HIGH (ref 10–35)
Albumin: 4.4 g/dL (ref 3.6–5.1)
Alkaline Phosphatase: 88 U/L (ref 40–115)
BILIRUBIN TOTAL: 0.7 mg/dL (ref 0.2–1.2)
BUN: 16 mg/dL (ref 7–25)
CHLORIDE: 99 mmol/L (ref 98–110)
CO2: 26 mmol/L (ref 20–31)
Calcium: 9.1 mg/dL (ref 8.6–10.3)
Creat: 0.77 mg/dL (ref 0.70–1.33)
GFR, Est African American: >89 mL/min (ref >=60)
Glucose, Bld: 94 mg/dL (ref 65–99)
POTASSIUM: 4 mmol/L (ref 3.5–5.3)
Sodium: 136 mmol/L (ref 135–146)
Total Protein: 8.4 g/dL — ABNORMAL HIGH (ref 6.1–8.1)

## 2015-08-14 LAB — LIPASE: Lipase: 90 U/L — ABNORMAL HIGH (ref 7–60)

## 2015-08-16 ENCOUNTER — Other Ambulatory Visit: Payer: Self-pay | Admitting: Physician Assistant

## 2015-08-16 ENCOUNTER — Ambulatory Visit (INDEPENDENT_AMBULATORY_CARE_PROVIDER_SITE_OTHER): Payer: BLUE CROSS/BLUE SHIELD | Admitting: Cardiology

## 2015-08-16 ENCOUNTER — Encounter: Payer: Self-pay | Admitting: Cardiology

## 2015-08-16 VITALS — BP 184/111 | HR 93 | Ht 66.0 in | Wt 155.1 lb

## 2015-08-16 DIAGNOSIS — I10 Essential (primary) hypertension: Secondary | ICD-10-CM

## 2015-08-16 DIAGNOSIS — R002 Palpitations: Secondary | ICD-10-CM | POA: Insufficient documentation

## 2015-08-16 DIAGNOSIS — F1010 Alcohol abuse, uncomplicated: Secondary | ICD-10-CM

## 2015-08-16 DIAGNOSIS — R0602 Shortness of breath: Secondary | ICD-10-CM | POA: Diagnosis not present

## 2015-08-16 DIAGNOSIS — R079 Chest pain, unspecified: Secondary | ICD-10-CM | POA: Diagnosis not present

## 2015-08-16 HISTORY — DX: Essential (primary) hypertension: I10

## 2015-08-16 MED ORDER — METOPROLOL TARTRATE 50 MG PO TABS: 50.0000 mg | ORAL_TABLET | Freq: Two times a day (BID) | ORAL | Status: DC

## 2015-08-16 MED ORDER — LISINOPRIL 10 MG PO TABS: 10.0000 mg | ORAL_TABLET | Freq: Every day | ORAL | Status: DC

## 2015-08-16 NOTE — Progress Notes (Signed)
Cardiology Office Note    Date:  08/17/2015   ID:  Nathan Stout, DOB 01/26/1966, MRN [id number]  PCP:  Shade FloodGREENE,JEFFREY R, MD  Cardiologist:  Armanda Magicraci Rudie Sermons, MD   Chief Complaint  Patient presents with  . Hypertension  . Shortness of Breath  . Chest Pain    History of Present Illness:  Nathan Stout is a 50 y.o. male with recently diagnosed HTN and ETOH abuse who is referred by his PCP. He apparently had been drinking at his brothers house a few nights ago and developed a HA and SOB that lasted an hour.  His BP was 225/1329mMHg.  He went home and later developed night sweat but no other symptoms.  He has been drinking 10-12 beers 7 days a week.  He father passed away of a CVA. He was diagnosed with hyperlipidemia in March.  He says that he will have an episode of CP 1-2 times a month that only lasts a few seconds that is a stabbing pain.  He denies any SOB currently.  He denies any LE edema, dizziness, syncope.      Past Medical History  Diagnosis Date  . Benign essential HTN 08/16/2015    No past surgical history on file.  Current Medications: Outpatient Prescriptions Prior to Visit  Medication Sig Dispense Refill  . atorvastatin (LIPITOR) 40 MG tablet TAKE 1 TABLET(40 MG) BY MOUTH DAILY 30 tablet 0  . metoprolol tartrate (LOPRESSOR) 25 MG tablet Take 0.5 tablets (12.5 mg total) by mouth 2 (two) times daily. 30 tablet 1   No facility-administered medications prior to visit.     Allergies:   Review of patient's allergies indicates no known allergies.   Social History   Social History  . Marital Status: Married    Spouse Name: N/A  . Number of Children: N/A  . Years of Education: N/A   Occupational History  . Curatormechanic    Social History Main Topics  . Smoking status: Former Games developermoker  . Smokeless tobacco: None  . Alcohol Use: No  . Drug Use: Yes     Comment: 10 beers daily - start drinking after work  . Sexual Activity: Yes   Other Topics Concern  . None   Social History  Narrative   Married - [name]   2 daughter   Work - Nurse, learning disabilityauto-repair   Exercise - none outside of work   From [place] - here since 1987     Family History:  The patient's family history includes Hypertension in his brother; Stroke in his father.   ROS:   Please see the history of present illness.    ROS All other systems reviewed and are negative.   PHYSICAL EXAM:   VS:  BP 184/111 mmHg  Pulse 93  Ht 5\' 6"  (1.676 m)  Wt 155 lb 1.9 oz (70.362 kg)  BMI 25.05 kg/m2  SpO2 99%   GEN: Well nourished, well developed, in no acute distress HEENT: normal Neck: no JVD, carotid bruits, or masses Cardiac: RRR; no murmurs, rubs, or gallops,no edema.  Intact distal pulses bilaterally.  Respiratory:  clear to auscultation bilaterally, normal work of breathing GI: soft, nontender, nondistended, + BS MS: no deformity or atrophy Skin: warm and dry, no rash Neuro:  Alert and Oriented x 3, Strength and sensation are intact Psych: euthymic mood, full affect  Wt Readings from Last 3 Encounters:  08/16/15 155 lb 1.9 oz (70.362 kg)  08/13/15 154 lb (69.854 kg)  05/13/15 158 lb (71.668 kg)  Studies/Labs Reviewed:   EKG:  EKG is not ordered today.    Recent Labs: 05/13/2015: Hemoglobin 16.7; Platelets 291; TSH 2.86 08/13/2015: ALT 85*; BUN 16; Creat 0.77; Potassium 4.0; Sodium 136   Lipid Panel    Component Value Date/Time   CHOL 301* 05/13/2015 0906   TRIG 258* 05/13/2015 0906   HDL 70 05/13/2015 0906   CHOLHDL 4.3 05/13/2015 0906   VLDL 52* 05/13/2015 0906   LDLCALC 179* 05/13/2015 0906    Additional studies/ records that were reviewed today include:  OV noted from PCP    ASSESSMENT:    1. Essential hypertension   2. SOB (shortness of breath)   3. ETOH abuse   4. Chest pain, unspecified chest pain type      PLAN:  In order of problems listed above:  1. HTN - This is poorly controlled.  He was started on metoprolol 25mg  BID 1/2 tablet but is taking a whole tablet BID.  I  have instructed him to increase his metoprolol to 50mg  BID and add Lisinopril 10mg  daily.  He will followup in HTN clinic on Monday 6/19 and with the PA in 4 weeks for followup of HTN.  2. SOB - this occurred in the setting of drinking a lot of ETOH and BP markedly elevated.  He denies any further SOB at this time.  I explained to him that alcohol is a toxin to the heart and can result in a DCM.  I have recommended that he start to wean himself off alcohol.  I will get a 2D echo to assess his LVF.  With his BP markedly elevated he could also have a hypertensive CM or SOB could be due to diastolic dysfunction and subendocardial ischemia from markedly elevated BP.  3.   ETOH abuse - he was encouraged to wean off ETOH.  4.   Chest pain- his pain is atypical and may be due to poorly controlled HTN, GERD from severe ETOH abuse or musculoskeletal.  He has cardiac risk factors including dyslipidemia, poorly controlled HTN and remote tobacco use.  I have recommended a Lexiscan myoview.  Given his markedly elevated BP would not do an ETT.    Medication Adjustments/Labs and Tests Ordered: Current medicines are reviewed at length with the patient today.  Concerns regarding medicines are outlined above.  Medication changes, Labs and Tests ordered today are listed in the Patient Instructions below.  Patient Instructions  Medication Instructions:  1) INCREASE METOPROLOL to 50 mg TWICE A DAY 2) START LISINOPRIL 10 mg daily  Labwork: None  Testing/Procedures: Your physician has requested that you have an echocardiogram. Echocardiography is a painless test that uses sound waves to create images of your heart. It provides your doctor with information about the size and shape of your heart and how well your heart's chambers and valves are working. This procedure takes approximately one hour. There are no restrictions for this procedure.   Your physician has requested that you have a lexiscan myoview. For  further information please visit [email or web address] Please follow instruction sheet, as given.  Follow-Up: Your physician recommends that you schedule a follow-up appointment ON Monday in the HTN Clinic.  Your physician recommends that you schedule a follow-up appointment in 4 WEEKS with a PA or NP.  Your physician recommends that you schedule a follow-up appointment in 3 MONTHS with Dr. Mayford Knife.  Any Other Special Instructions Will Be Listed Below (If Applicable).     If you need a refill on your  cardiac medications before your next appointment, please call your pharmacy.       Signed, Armanda Magic, MD  08/17/2015 12:21 PM    [facility] [address] [place], [place], [place]  [address] Phone: [phone]; Fax: [phone]

## 2015-08-16 NOTE — Patient Instructions (Addendum)
Medication Instructions:  1) INCREASE METOPROLOL to 50 mg TWICE A DAY 2) START LISINOPRIL 10 mg daily  Labwork: None  Testing/Procedures: Your physician has requested that you have an echocardiogram. Echocardiography is a painless test that uses sound waves to create images of your heart. It provides your doctor with information about the size and shape of your heart and how well your heart's chambers and valves are working. This procedure takes approximately one hour. There are no restrictions for this procedure.   Your physician has requested that you have a lexiscan myoview. For further information please visit [email or web address]. Please follow instruction sheet, as given.  Follow-Up: Your physician recommends that you schedule a follow-up appointment ON Monday in the HTN Clinic.  Your physician recommends that you schedule a follow-up appointment in 4 WEEKS with a PA or NP.  Your physician recommends that you schedule a follow-up appointment in 3 MONTHS with Dr. Mayford Knifeurner.  Any Other Special Instructions Will Be Listed Below (If Applicable).     If you need a refill on your cardiac medications before your next appointment, please call your pharmacy.

## 2015-08-20 ENCOUNTER — Ambulatory Visit (INDEPENDENT_AMBULATORY_CARE_PROVIDER_SITE_OTHER): Payer: BLUE CROSS/BLUE SHIELD | Admitting: Pharmacist

## 2015-08-20 VITALS — BP 160/106 | HR 72

## 2015-08-20 DIAGNOSIS — I10 Essential (primary) hypertension: Secondary | ICD-10-CM | POA: Diagnosis not present

## 2015-08-20 LAB — BASIC METABOLIC PANEL
BUN: 13 mg/dL (ref 7–25)
CHLORIDE: 100 mmol/L (ref 98–110)
CO2: 25 mmol/L (ref 20–31)
Calcium: 9 mg/dL (ref 8.6–10.3)
Creat: 0.84 mg/dL (ref 0.70–1.33)
Glucose, Bld: 170 mg/dL — ABNORMAL HIGH (ref 65–99)
Potassium: 4.4 mmol/L (ref 3.5–5.3)
SODIUM: 138 mmol/L (ref 135–146)

## 2015-08-20 NOTE — Patient Instructions (Signed)
Increase your lisinopril to 2 tablets a day.    We will check blood work today to look at your kidney function.  I will call you tomorrow with those results.   Call our office if you have any problems.  We will recheck your blood pressure next week.

## 2015-08-21 MED ORDER — LISINOPRIL 10 MG PO TABS: 20.0000 mg | ORAL_TABLET | Freq: Every day | ORAL | Status: DC

## 2015-08-21 NOTE — Progress Notes (Signed)
Patient ID: Nathan Stout                 DOB: 05/06/1965                      MRN: [id number]     HPI: Nathan Lavoan Vuncannon is a 50 y.o. male referred by Dr. Mayford Knifeurner to HTN clinic.  He was first seen by Dr. Mayford Knifeurner on 6/15 at the request of his PCP.  He has a history of newly diagnosed HTN, HLD, and EtOH abuse.  His BP a week or so ago was as high was 225/129.  He states he did have some HA and SOB but resolved within an hour.  Dr. Mayford Knifeurner increased his metoprolol to 50mg  BID and started lisinopril 10mg  daily.  He is here today for follow up.   Pt states he is tolerating medications well.  He does have a HA but states this is all the time.  There is no noticeable trigger or pain relief he uses.  No blurred vision or dizziness reported.  He does not check his BP at home.  He states he has tried to cut back the number of days he drinks alcohol to only 3-4 per week but still drinks at least 10 drinks on those days.  Current HTN meds: lisinopril 10mg  daily, metoprolol 50mg  BID Previously tried: n/a BP goal: 140/90  Social History: Pt continues to drink heavily.  Has ~10 drinks 3-4 days of the week.  Diet: Pt does not limit his salt intake.  He does cook most of his food at home.  He does not drink caffeine.    Exercise: Pt does not have a regular exercise routine.  He works as an Journalist, newspaperauto mechanic.   Home BP readings: Pt does not have a BP cuff at home.   Wt Readings from Last 3 Encounters:  08/16/15 155 lb 1.9 oz (70.362 kg)  08/13/15 154 lb (69.854 kg)  05/13/15 158 lb (71.668 kg)   BP Readings from Last 3 Encounters:  08/20/15 160/106  08/16/15 184/111  08/13/15 154/100   Pulse Readings from Last 3 Encounters:  08/20/15 72  08/16/15 93  08/13/15 80    Renal function: Estimated Creatinine Clearance: 93.9 mL/min (by C-G formula based on Cr of 0.84).  Past Medical History  Diagnosis Date  . Benign essential HTN 08/16/2015    Current Outpatient Prescriptions on File Prior to Visit  Medication Sig  Dispense Refill  . atorvastatin (LIPITOR) 40 MG tablet TAKE 1 TABLET(40 MG) BY MOUTH DAILY 90 tablet 0  . lisinopril (PRINIVIL,ZESTRIL) 10 MG tablet Take 1 tablet (10 mg total) by mouth daily. 90 tablet 3  . metoprolol tartrate (LOPRESSOR) 50 MG tablet Take 1 tablet (50 mg total) by mouth 2 (two) times daily. 60 tablet 11   No current facility-administered medications on file prior to visit.    No Known Allergies   Assessment/Plan: 1. Hypertension- Pt's BP much improved with the addition of lisinopril but still elevated.  Will check BMET today. If stable, plan to increase lisinopril to 20mg  daily and recheck BP and labs next week.  Discussed lifestyle changes with patient including limiting salt and decreasing alcohol intake.

## 2015-08-27 ENCOUNTER — Ambulatory Visit (INDEPENDENT_AMBULATORY_CARE_PROVIDER_SITE_OTHER): Payer: BLUE CROSS/BLUE SHIELD | Admitting: Pharmacist

## 2015-08-27 VITALS — BP 148/94 | HR 64

## 2015-08-27 DIAGNOSIS — I10 Essential (primary) hypertension: Secondary | ICD-10-CM

## 2015-08-27 NOTE — Patient Instructions (Addendum)
Increase lisinopril to 40mg  daily (4 of your 10mg  tablets) Continue taking your metoprolol 50mg  twice a day  Recheck blood pressure in 2 weeks

## 2015-08-27 NOTE — Progress Notes (Signed)
Patient ID: Nathan Stout                 DOB: 10/20/1965                      MRN: [id number]     HPI: Nathan Lavoan Herda is a 50 y.o. male referred by Dr. Mayford Knifeurner to HTN clinic. He was first seen by Dr. Mayford Knifeurner on 6/15 at the request of his PCP. He has a history of newly diagnosed HTN, HLD, and EtOH abuse. His BP a week or so ago was as high was 225/129. He states he did have some HA and SOB but resolved within an hour. Dr. Mayford Knifeurner increased his metoprolol to 50mg  BID and started lisinopril 10mg  daily. At HTN visit last week, lisinopril dose was increased to 20mg  daily. Patient presents today for BP check and BMET.  Pt states he is tolerating medications well. He does have a HA but states this is all the time. There is no noticeable trigger or pain relief he uses. No blurred vision or dizziness reported. He does not check his BP at home. He states he has tried to cut back the number of days he drinks alcohol to only 3-4 per week but still drinks at least 10 drinks on those days.  Current HTN meds: lisinopril 20mg  daily, metoprolol 50mg  BID Previously tried: n/a BP goal: < 140/5290mmHg  Social History: Pt continues to drink heavily. Has ~10 drinks 3-4 days of the week.  Diet: Pt does not limit his salt intake. He does cook most of his food at home. He does not drink caffeine.   Exercise: Pt does not have a regular exercise routine. He works as an Journalist, newspaperauto mechanic.   Home BP readings: Pt does not have a BP cuff at home.    Wt Readings from Last 3 Encounters:  08/16/15 155 lb 1.9 oz (70.362 kg)  08/13/15 154 lb (69.854 kg)  05/13/15 158 lb (71.668 kg)   BP Readings from Last 3 Encounters:  08/20/15 160/106  08/16/15 184/111  08/13/15 154/100   Pulse Readings from Last 3 Encounters:  08/20/15 72  08/16/15 93  08/13/15 80    Renal function: Estimated Creatinine Clearance: 93.9 mL/min (by C-G formula based on Cr of 0.84).  Past Medical History  Diagnosis Date  . Benign essential HTN  08/16/2015    Current Outpatient Prescriptions on File Prior to Visit  Medication Sig Dispense Refill  . atorvastatin (LIPITOR) 40 MG tablet TAKE 1 TABLET(40 MG) BY MOUTH DAILY 90 tablet 0  . lisinopril (PRINIVIL,ZESTRIL) 10 MG tablet Take 2 tablets (20 mg total) by mouth daily. 90 tablet 3  . metoprolol tartrate (LOPRESSOR) 50 MG tablet Take 1 tablet (50 mg total) by mouth 2 (two) times daily. 60 tablet 11   No current facility-administered medications on file prior to visit.    No Known Allergies   Assessment/Plan:  1. HTN - BP improved but still above goal  <140/4990mmHg. Will increase lisinopril to 40mg  daily. Checking BMET today. F/u in 2 weeks.   Floria Brandau E. Nussen Pullin, PharmD, CPP [facility] [address], [place], [place] [address] Phone: [phone]) [phone]; Fax: [phone]) [phone] 08/27/2015 4:08 PM

## 2015-08-28 ENCOUNTER — Other Ambulatory Visit: Payer: Self-pay | Admitting: Pharmacist

## 2015-08-28 LAB — BASIC METABOLIC PANEL
BUN: 18 mg/dL (ref 7–25)
CALCIUM: 9.4 mg/dL (ref 8.6–10.3)
CO2: 22 mmol/L (ref 20–31)
CREATININE: 0.73 mg/dL (ref 0.70–1.33)
Chloride: 104 mmol/L (ref 98–110)
Glucose, Bld: 82 mg/dL (ref 65–99)
Potassium: 4.5 mmol/L (ref 3.5–5.3)
SODIUM: 137 mmol/L (ref 135–146)

## 2015-08-28 MED ORDER — LISINOPRIL 40 MG PO TABS: 40.0000 mg | ORAL_TABLET | Freq: Every day | ORAL | Status: DC

## 2015-09-03 ENCOUNTER — Telehealth (HOSPITAL_COMMUNITY): Payer: Self-pay | Admitting: *Deleted

## 2015-09-03 NOTE — Telephone Encounter (Signed)
Attempted to contact patient in reference of nuclear stress test on7/7/17. No answer and mailbox full.Jaydee Ingman, Adelene IdlerCynthia W

## 2015-09-06 ENCOUNTER — Telehealth: Payer: Self-pay | Admitting: Cardiology

## 2015-09-06 NOTE — Telephone Encounter (Signed)
Pt cancelled echo and nuclear scheduled for 09/07/2015, I called pt to reschedule echo and nuclear pt advised he did not want to have the test.  I told him I would let Dr. Mayford Knifeurner know

## 2015-09-07 ENCOUNTER — Encounter (HOSPITAL_COMMUNITY): Payer: BLUE CROSS/BLUE SHIELD

## 2015-09-07 ENCOUNTER — Other Ambulatory Visit (HOSPITAL_COMMUNITY): Payer: BLUE CROSS/BLUE SHIELD

## 2015-09-11 ENCOUNTER — Ambulatory Visit: Payer: BLUE CROSS/BLUE SHIELD

## 2015-09-11 NOTE — Telephone Encounter (Signed)
Please let PCP know

## 2015-09-12 NOTE — Telephone Encounter (Signed)
Forwarded to PCP.

## 2015-09-12 NOTE — Progress Notes (Signed)
Cardiology Office Note:    Date:  09/13/2015   ID:  Nathan Stout, DOB November 28, 1965, MRN [id number]  PCP:  Shade Flood, MD  Cardiologist:  Dr. Armanda Magic   Electrophysiologist:  n/a  Referring MD: Shade Flood, MD   Chief Complaint  Patient presents with  . Follow-up    BP, chest pain, dyspnea    History of Present Illness:     Nathan Stout is a 50 y.o. male with a hx of HTN, ETOH abuse.  He was evaluated by Dr. Armanda Magic 08/16/15 for HTN, chest pain and dyspnea.  HTN medications were adjusted.  Myoview and Echo were both ordered.  He has been followed in the HTN clinic with adjustments made in his hypertensive regimen.  The patient cancelled both the Myoview and the Echo.  He returns for FU.  He is here alone. He did not reschedule his stress test or echocardiogram. When I asked him about chest discomfort today, he denies this. He really denies significant dyspnea at this time. He denies orthopnea, PND or edema. He denies syncope. He continues to drink alcohol. He drinks 10 beers, at a time, 3 days a week.  Past Medical History  Diagnosis Date  . Benign essential HTN 08/16/2015  . HLD (hyperlipidemia)   . Alcohol abuse     History reviewed. No pertinent past surgical history.  Current Medications: Outpatient Prescriptions Prior to Visit  Medication Sig Dispense Refill  . atorvastatin (LIPITOR) 40 MG tablet TAKE 1 TABLET(40 MG) BY MOUTH DAILY 90 tablet 0  . metoprolol tartrate (LOPRESSOR) 50 MG tablet Take 1 tablet (50 mg total) by mouth 2 (two) times daily. 60 tablet 11  . lisinopril (PRINIVIL,ZESTRIL) 40 MG tablet Take 1 tablet (40 mg total) by mouth daily. (Patient not taking: Reported on 09/13/2015) 30 tablet 11   No facility-administered medications prior to visit.      Allergies:   Review of patient's allergies indicates no known allergies.   Social History   Social History  . Marital Status: Married    Spouse Name: N/A  . Number of Children: N/A  . Years of  Education: N/A   Occupational History  . Curator    Social History Main Topics  . Smoking status: Former Games developer  . Smokeless tobacco: None  . Alcohol Use: No  . Drug Use: Yes     Comment: 10 beers daily - start drinking after work  . Sexual Activity: Yes   Other Topics Concern  . None   Social History Narrative   Married - [name]   2 daughter   Work - Nurse, learning disability   Exercise - none outside of work   From [place] - here since 1987     Family History:  The patient's family history includes Hypertension in his brother; Stroke in his father.   ROS:   Please see the history of present illness.    ROS All other systems reviewed and are negative.   Physical Exam:    VS:  BP 150/100 mmHg  Pulse 60  Ht  (1.651 m)  Wt 154 lb 12.8 oz (70.217 kg)  BMI 25.76 kg/m2   Physical Exam  Constitutional: He is oriented to person, place, and time. He appears well-developed and well-nourished. No distress.  HENT:  Head: Normocephalic and atraumatic.  Eyes: No scleral icterus.  Neck: No JVD present.  Cardiovascular: Normal rate, regular rhythm and normal heart sounds.   No murmur heard. Pulmonary/Chest: Effort normal. He has no  wheezes. He has no rales.  Abdominal: Soft. There is no tenderness.  Musculoskeletal: He exhibits no edema.  Neurological: He is alert and oriented to person, place, and time.  Skin: Skin is warm and dry.  Psychiatric: He has a normal mood and affect.    Wt Readings from Last 3 Encounters:  09/13/15 154 lb 12.8 oz (70.217 kg)  08/16/15 155 lb 1.9 oz (70.362 kg)  08/13/15 154 lb (69.854 kg)      Studies/Labs Reviewed:     EKG:  EKG is  ordered today.  The ekg ordered today demonstrates Sinus bradycardia, HR 59, normal axis, QTc 419 ms, PR interval 222 ms  Recent Labs: 05/13/2015: Hemoglobin 16.7; Platelets 291; TSH 2.86 08/13/2015: ALT 85* 08/27/2015: BUN 18; Creat 0.73; Potassium 4.5; Sodium 137   Recent Lipid Panel    Component Value Date/Time    CHOL 301* 05/13/2015 0906   TRIG 258* 05/13/2015 0906   HDL 70 05/13/2015 0906   CHOLHDL 4.3 05/13/2015 0906   VLDL 52* 05/13/2015 0906   LDLCALC 179* 05/13/2015 0906    Additional studies/ records that were reviewed today include:   None   ASSESSMENT:     1. Benign essential HTN   2. Chest pain, unspecified chest pain type   3. SOB (shortness of breath)   4. ETOH abuse     PLAN:     In order of problems listed above:  1. HTN - His blood pressure remains markedly uncontrolled. He has been followed in the hypertension clinic. He is on maximum dose lisinopril. With his bradycardia, he can not tolerate any increase in metoprolol. I will add amlodipine 5 mg daily. I have encouraged him to go ahead and get the echocardiogram. Obtain follow-up BMET today. If blood pressure remains uncontrolled, consider changing lisinopril to lisinopril/HCTZ.  2. Chest pain - Overall, he denies this. He canceled his stress test. Will hold off on reordering for now.  3. Dyspnea - As noted, he denies this. However, with his high blood pressure, it would be beneficial to go ahead and proceed with an echocardiogram to rule out significant LVH and assess his LV function.  4. ETOH abuse - I have recommended reducing intake. Obtain echocardiogram to rule out alcohol induced cardiomyopathy.   Medication Adjustments/Labs and Tests Ordered: Current medicines are reviewed at length with the patient today.  Concerns regarding medicines are outlined above.  Medication changes, Labs and Tests ordered today are outlined in the Patient Instructions noted below. Patient Instructions  Medication Instructions:  1. START NORVASC 5 MG DAILY; RX SENT Labwork: 1. TODAY BMET Testing/Procedures: Your physician has requested that you have an echocardiogram. Echocardiography is a painless test that uses sound waves to create images of your heart. It provides your doctor with information about the size and shape of your  heart and how well your heart's chambers and valves are working. This procedure takes approximately one hour. There are no restrictions for this procedure. Follow-Up: Wai Litt, PAC 3-4 WEEKS  Any Other Special Instructions Will Be Listed Below (If Applicable). If you need a refill on your cardiac medications before your next appointment, please call your pharmacy.   Signed, Tereso NewcomerScott Suleyman Ehrman, PA-C  09/13/2015 4:51 PM    [facility] [address], [place], [place]  [address] Phone: [phone]([phone]; Fax: [phone](336) [phone]

## 2015-09-13 ENCOUNTER — Ambulatory Visit (INDEPENDENT_AMBULATORY_CARE_PROVIDER_SITE_OTHER): Payer: BLUE CROSS/BLUE SHIELD | Admitting: Physician Assistant

## 2015-09-13 ENCOUNTER — Encounter: Payer: Self-pay | Admitting: Physician Assistant

## 2015-09-13 ENCOUNTER — Encounter: Payer: BLUE CROSS/BLUE SHIELD | Admitting: Pharmacist

## 2015-09-13 ENCOUNTER — Encounter (INDEPENDENT_AMBULATORY_CARE_PROVIDER_SITE_OTHER): Payer: Self-pay

## 2015-09-13 VITALS — BP 150/100 | HR 60 | Ht 65.0 in | Wt 154.8 lb

## 2015-09-13 DIAGNOSIS — F1010 Alcohol abuse, uncomplicated: Secondary | ICD-10-CM

## 2015-09-13 DIAGNOSIS — R079 Chest pain, unspecified: Secondary | ICD-10-CM | POA: Diagnosis not present

## 2015-09-13 DIAGNOSIS — R0602 Shortness of breath: Secondary | ICD-10-CM | POA: Diagnosis not present

## 2015-09-13 DIAGNOSIS — I10 Essential (primary) hypertension: Secondary | ICD-10-CM

## 2015-09-13 MED ORDER — AMLODIPINE BESYLATE 5 MG PO TABS: 5.0000 mg | ORAL_TABLET | Freq: Every day | ORAL | Status: DC

## 2015-09-13 NOTE — Patient Instructions (Addendum)
Medication Instructions:  1. START NORVASC 5 MG DAILY; RX SENT Labwork: 1. TODAY BMET Testing/Procedures: Your physician has requested that you have an echocardiogram. Echocardiography is a painless test that uses sound waves to create images of your heart. It provides your doctor with information about the size and shape of your heart and how well your heart's chambers and valves are working. This procedure takes approximately one hour. There are no restrictions for this procedure. Follow-Up: SCOTT WEAVER, PAC 3-4 WEEKS  Any Other Special Instructions Will Be Listed Below (If Applicable). If you need a refill on your cardiac medications before your next appointment, please call your pharmacy.

## 2015-09-13 NOTE — Progress Notes (Signed)
Patient ID: Nathan Stout                 DOB: 06/22/1965                      MRN: [id number]     HPI: Nathan Lavoan Devins is a 50 y.o. male referred by Dr. Mayford Knifeurner to HTN clinic. He was first seen by Dr. Mayford Knifeurner on 6/15 at the request of his PCP. He has a history of newly diagnosed HTN, HLD, and EtOH abuse. His BP a week or so ago was as high was 225/129. He states he did have some HA and SOB but resolved within an hour. Dr. Mayford Knifeurner increased his metoprolol to 50mg  BID and started lisinopril 10mg  daily. Lisinopril dose has been titrated up to 40mg  at recent visits.  Pt states he is tolerating medications well. He does have a HA but states this is all the time. There is no noticeable trigger or pain relief he uses. No blurred vision or dizziness reported. He does not check his BP at home. He states he has tried to cut back the number of days he drinks alcohol to only 3-4 per week but still drinks at least 10 drinks on those days.  Current HTN meds: lisinopril 40mg  daily, metoprolol 50mg  BID Previously tried: n/a BP goal: < 140/5590mmHg  Social History: Pt continues to drink heavily. Has ~10 drinks 3-4 days of the week.  Family History: Father died from stroke, brother with HTN and HLD.  Diet: Pt does not limit his salt intake. He does cook most of his food at home. He does not drink caffeine.   Exercise: Pt does not have a regular exercise routine. He works as an Journalist, newspaperauto mechanic.   Home BP readings: Pt does not have a BP cuff at home.   Wt Readings from Last 3 Encounters:  08/16/15 155 lb 1.9 oz (70.362 kg)  08/13/15 154 lb (69.854 kg)  05/13/15 158 lb (71.668 kg)   BP Readings from Last 3 Encounters:  08/27/15 148/94  08/20/15 160/106  08/16/15 184/111   Pulse Readings from Last 3 Encounters:  08/27/15 64  08/20/15 72  08/16/15 93    Renal function: CrCl cannot be calculated (Unknown ideal weight.).  Past Medical History  Diagnosis Date  . Benign essential HTN 08/16/2015     Current Outpatient Prescriptions on File Prior to Visit  Medication Sig Dispense Refill  . atorvastatin (LIPITOR) 40 MG tablet TAKE 1 TABLET(40 MG) BY MOUTH DAILY 90 tablet 0  . lisinopril (PRINIVIL,ZESTRIL) 40 MG tablet Take 1 tablet (40 mg total) by mouth daily. 30 tablet 11  . metoprolol tartrate (LOPRESSOR) 50 MG tablet Take 1 tablet (50 mg total) by mouth 2 (two) times daily. 60 tablet 11   No current facility-administered medications on file prior to visit.    No Known Allergies   Assessment/Plan:  1. HTN - Add amlodipine if needed.  This encounter was created in error - please disregard.

## 2015-09-14 ENCOUNTER — Telehealth: Payer: Self-pay | Admitting: *Deleted

## 2015-09-14 LAB — BASIC METABOLIC PANEL
BUN: 12 mg/dL (ref 7–25)
CHLORIDE: 100 mmol/L (ref 98–110)
CO2: 24 mmol/L (ref 20–31)
CREATININE: 0.85 mg/dL (ref 0.70–1.33)
Calcium: 9.4 mg/dL (ref 8.6–10.3)
Glucose, Bld: 85 mg/dL (ref 65–99)
POTASSIUM: 4.3 mmol/L (ref 3.5–5.3)
SODIUM: 137 mmol/L (ref 135–146)

## 2015-09-14 NOTE — Telephone Encounter (Signed)
Pt notified of lab results by phone with verbal understanding.  

## 2015-09-25 ENCOUNTER — Telehealth: Payer: Self-pay | Admitting: Physician Assistant

## 2015-09-25 NOTE — Telephone Encounter (Signed)
-----   Message from Elita Boone sent at 09/25/2015 11:33 AM EDT ----- Regarding: Cancelled Echo Good afternoon Okey Regal,  I wanted to inform you know that I reached out to the patient to see if he wanted to reschedule his echo and he voiced that he did not have the time right now and would call back at a later time to reschedule. I wanted to let you know that I was going to remove him from the workqueue because he did not have a future date in mind. Have a great day!  Rene Kocher

## 2015-10-01 ENCOUNTER — Other Ambulatory Visit (HOSPITAL_COMMUNITY): Payer: BLUE CROSS/BLUE SHIELD

## 2015-10-08 NOTE — Progress Notes (Signed)
Cardiology Office Note:    Date:  10/09/2015   ID:  Nathan Stout, DOB Oct 15, 1965, MRN [id number]  PCP:  Shade Flood, MD  Cardiologist:  Dr. Armanda Magic   Electrophysiologist:  n/a  Referring MD: Shade Flood, MD   Chief Complaint  Patient presents with  . Follow-up    HTN    History of Present Illness:    Nathan Stout is a 50 y.o. male with a hx of HTN, ETOH abuse.  He was evaluated by Dr. Armanda Magic 08/16/15 for HTN, chest pain and dyspnea.  HTN medications were adjusted.  Myoview and Echo were both ordered.  He has been followed in the HTN clinic with adjustments made in his hypertensive regimen.  The patient cancelled both the Myoview and the Echo. He drinks 10 beers, at a time, 3 days a week.  I saw him 7/17 for FU. I put him on Amlodipine for his HTN.  I again asked him to get the Echo.  However, he canceled this.  Returns for FU.  He is here alone.  He is doing well. The patient denies chest pain, shortness of breath, syncope, orthopnea, PND or significant pedal edema.  He feels much better on his current medications.    Prior CV studies that were reviewed today include:    None   Past Medical History:  Diagnosis Date  . Alcohol abuse   . Benign essential HTN 08/16/2015  . HLD (hyperlipidemia)     Current Medications: Current Meds  Medication Sig  . amLODipine (NORVASC) 5 MG tablet Take 1 tablet (5 mg total) by mouth daily.  Marland Kitchen atorvastatin (LIPITOR) 40 MG tablet TAKE 1 TABLET(40 MG) BY MOUTH DAILY  . lisinopril (PRINIVIL,ZESTRIL) 40 MG tablet Take 40 mg by mouth daily.   . metoprolol tartrate (LOPRESSOR) 50 MG tablet Take 1 tablet (50 mg total) by mouth 2 (two) times daily.       Allergies:   Review of patient's allergies indicates no known allergies.   Social History   Social History  . Marital status: Married    Spouse name: N/A  . Number of children: N/A  . Years of education: N/A   Occupational History  . Curator    Social History Main Topics  .  Smoking status: Former Games developer  . Smokeless tobacco: Former Neurosurgeon  . Alcohol use No  . Drug use:      Comment: 10 beers daily - start drinking after work  . Sexual activity: Yes   Other Topics Concern  . None   Social History Narrative   Married - [name]   2 daughter   Work - Nurse, learning disability   Exercise - none outside of work   From [place] - here since 1987    ROS:   Please see the history of present illness.    ROS All other systems reviewed and are negative.   EKGs/Labs/Other Test Reviewed:    EKG:  EKG is not ordered today.  The ekg ordered today demonstrates n/a  Recent Labs: 05/13/2015: Hemoglobin 16.7; Platelets 291; TSH 2.86 08/13/2015: ALT 85 09/13/2015: BUN 12; Creat 0.85; Potassium 4.3; Sodium 137   Recent Lipid Panel    Component Value Date/Time   CHOL 301 (H) 05/13/2015 0906   TRIG 258 (H) 05/13/2015 0906   HDL 70 05/13/2015 0906   CHOLHDL 4.3 05/13/2015 0906   VLDL 52 (H) 05/13/2015 0906   LDLCALC 179 (H) 05/13/2015 0906    Physical Exam:    VS:  BP 118/78   Pulse 88   Ht 5\' 5"  (1.651 m)   Wt 152 lb 1.9 oz (69 kg)   BMI 25.31 kg/m     Wt Readings from Last 3 Encounters:  10/09/15 152 lb 1.9 oz (69 kg)  09/13/15 154 lb 12.8 oz (70.2 kg)  08/16/15 155 lb 1.9 oz (70.4 kg)     Physical Exam  Constitutional: He is oriented to person, place, and time. He appears well-developed and well-nourished. No distress.  HENT:  Head: Normocephalic and atraumatic.  Eyes: No scleral icterus.  Neck: No JVD present.  Cardiovascular: Normal rate, regular rhythm, S1 normal and S2 normal.  Exam reveals no gallop and no friction rub.   No murmur heard. Pulmonary/Chest: Effort normal and breath sounds normal. He has no wheezes. He has no rhonchi. He has no rales.  Abdominal: Soft. He exhibits no distension and no mass. There is no tenderness.  Musculoskeletal: Normal range of motion. He exhibits no edema.  Neurological: He is alert and oriented to person, place, and  time.  Skin: Skin is warm and dry.  Psychiatric: He has a normal mood and affect.    ASSESSMENT:    1. Benign essential HTN   2. ETOH abuse    PLAN:    In order of problems listed above:  1. HTN - BP improved.  Now optimal.  Continue current medications.  He does not want to schedule an echocardiogram or stress test.  2. ETOH abuse - I have again asked him to reduced his ETOH intake.   Medication Adjustments/Labs and Tests Ordered: Current medicines are reviewed at length with the patient today.  Concerns regarding medicines are outlined above.  Medication changes, Labs and Tests ordered today are outlined in the Patient Instructions noted below. Patient Instructions  Medication Instructions:  No changes. See your medication list.  Labwork: None   Testing/Procedures: None   Follow-Up: Dr. Armanda Magicraci Turner on 01/11/16 @ 3:45   Any Other Special Instructions Will Be Listed Below (If Applicable).  If you need a refill on your cardiac medications before your next appointment, please call your pharmacy.   Signed, Tereso NewcomerScott Vannak Montenegro, PA-C  10/09/2015 4:08 PM    [facility] [address], [place], [place]  [address] Phone: [phone](336) [phone]; Fax: [phone]

## 2015-10-09 ENCOUNTER — Other Ambulatory Visit: Payer: Self-pay | Admitting: Family Medicine

## 2015-10-09 ENCOUNTER — Encounter: Payer: Self-pay | Admitting: Physician Assistant

## 2015-10-09 ENCOUNTER — Ambulatory Visit (INDEPENDENT_AMBULATORY_CARE_PROVIDER_SITE_OTHER): Payer: BLUE CROSS/BLUE SHIELD | Admitting: Physician Assistant

## 2015-10-09 VITALS — BP 118/78 | HR 88 | Ht 65.0 in | Wt 152.1 lb

## 2015-10-09 DIAGNOSIS — I10 Essential (primary) hypertension: Secondary | ICD-10-CM

## 2015-10-09 DIAGNOSIS — F1010 Alcohol abuse, uncomplicated: Secondary | ICD-10-CM

## 2015-10-09 NOTE — Patient Instructions (Addendum)
Medication Instructions:  No changes. See your medication list.  Labwork: None   Testing/Procedures: None   Follow-Up: Dr. Armanda Magicraci Turner on 01/11/16 @ 3:45   Any Other Special Instructions Will Be Listed Below (If Applicable).  If you need a refill on your cardiac medications before your next appointment, please call your pharmacy.

## 2015-11-12 ENCOUNTER — Other Ambulatory Visit: Payer: Self-pay | Admitting: Family Medicine

## 2015-11-22 ENCOUNTER — Ambulatory Visit: Payer: BLUE CROSS/BLUE SHIELD | Admitting: Cardiology

## 2016-01-11 ENCOUNTER — Encounter (INDEPENDENT_AMBULATORY_CARE_PROVIDER_SITE_OTHER): Payer: Self-pay

## 2016-01-11 ENCOUNTER — Encounter: Payer: Self-pay | Admitting: Cardiology

## 2016-01-11 ENCOUNTER — Ambulatory Visit (INDEPENDENT_AMBULATORY_CARE_PROVIDER_SITE_OTHER): Payer: BLUE CROSS/BLUE SHIELD | Admitting: Cardiology

## 2016-01-11 VITALS — BP 139/92 | HR 70 | Ht 65.0 in | Wt 156.8 lb

## 2016-01-11 DIAGNOSIS — I10 Essential (primary) hypertension: Secondary | ICD-10-CM | POA: Diagnosis not present

## 2016-01-11 DIAGNOSIS — R079 Chest pain, unspecified: Secondary | ICD-10-CM | POA: Insufficient documentation

## 2016-01-11 MED ORDER — AMLODIPINE BESYLATE 5 MG PO TABS: 7.5000 mg | ORAL_TABLET | Freq: Every day | ORAL | 11 refills | Status: DC

## 2016-01-11 NOTE — Progress Notes (Signed)
Cardiology Office Note    Date:  01/11/2016   ID:  Nathan Stout, DOB 03/16/1965, MRN [id number]  PCP:  Shade FloodGREENE,JEFFREY R, MD  Cardiologist:  Armanda Magicraci Zoiee Wimmer, MD   Chief Complaint  Patient presents with  . Chest Pain  . Hypertension    History of Present Illness:  Nathan Stout is a 50 y.o. male  with history of HTN, hyperlipidemia and ETOH abuse who is here for followup.    He denies any SOB or chest pain. He continues to drink alcohol.  When he first saw me he complained of SOB and CP and he was set up for an echo and stress myoview which he cancelled.  He saw Tereso NewcomerScott Weaver PA and it was recommended that he follow through with testing but declined.  He says that he does not need the studies and feels fine.   He denies any LE edema, dizziness, palpitations or syncope.      Past Medical History:  Diagnosis Date  . Alcohol abuse   . Benign essential HTN 08/16/2015  . HLD (hyperlipidemia)     History reviewed. No pertinent surgical history.  Current Medications: Outpatient Medications Prior to Visit  Medication Sig Dispense Refill  . amLODipine (NORVASC) 5 MG tablet Take 1 tablet (5 mg total) by mouth daily. 180 tablet 3  . atorvastatin (LIPITOR) 40 MG tablet TAKE 1 TABLET(40 MG) BY MOUTH DAILY 90 tablet 0  . lisinopril (PRINIVIL,ZESTRIL) 40 MG tablet Take 40 mg by mouth daily.     . metoprolol tartrate (LOPRESSOR) 50 MG tablet Take 1 tablet (50 mg total) by mouth 2 (two) times daily. 60 tablet 11   No facility-administered medications prior to visit.      Allergies:   Patient has no known allergies.   Social History   Social History  . Marital status: Married    Spouse name: N/A  . Number of children: N/A  . Years of education: N/A   Occupational History  . Curatormechanic    Social History Main Topics  . Smoking status: Former Games developermoker  . Smokeless tobacco: Former NeurosurgeonUser  . Alcohol use No  . Drug use:      Comment: 10 beers daily - start drinking after work  . Sexual activity: Yes    Other Topics Concern  . None   Social History Narrative   Married - [name]   2 daughter   Work - Nurse, learning disabilityauto-repair   Exercise - none outside of work   From [place] - here since 1987     Family History:  The patient's family history includes Hypertension in his brother; Stroke in his father.   ROS:   Please see the history of present illness.    ROS All other systems reviewed and are negative.  No flowsheet data found.     PHYSICAL EXAM:   VS:  BP (!) 139/92   Pulse 70   Ht 5\' 5"  (1.651 m)   Wt 156 lb 12.8 oz (71.1 kg)   BMI 26.09 kg/m    GEN: Well nourished, well developed, in no acute distress  HEENT: normal  Neck: no JVD, carotid bruits, or masses Cardiac: RRR; no murmurs, rubs, or gallops,no edema.  Intact distal pulses bilaterally.  Respiratory:  clear to auscultation bilaterally, normal work of breathing GI: soft, nontender, nondistended, + BS MS: no deformity or atrophy  Skin: warm and dry, no rash Neuro:  Alert and Oriented x 3, Strength and sensation are intact Psych: euthymic mood, full  affect  Wt Readings from Last 3 Encounters:  01/11/16 156 lb 12.8 oz (71.1 kg)  10/09/15 152 lb 1.9 oz (69 kg)  09/13/15 154 lb 12.8 oz (70.2 kg)      Studies/Labs Reviewed:   EKG:  EKG is not ordered today.    Recent Labs: 05/13/2015: Hemoglobin 16.7; Platelets 291; TSH 2.86 08/13/2015: ALT 85 09/13/2015: BUN 12; Creat 0.85; Potassium 4.3; Sodium 137   Lipid Panel    Component Value Date/Time   CHOL 301 (H) 05/13/2015 0906   TRIG 258 (H) 05/13/2015 0906   HDL 70 05/13/2015 0906   CHOLHDL 4.3 05/13/2015 0906   VLDL 52 (H) 05/13/2015 0906   LDLCALC 179 (H) 05/13/2015 0906    Additional studies/ records that were reviewed today include:  none    ASSESSMENT:    1. Benign essential HTN   2. Chest pain, unspecified type      PLAN:  In order of problems listed above:  1. HTN - diastolic BP is still too high.  I am going to have him increase amlodipine to  7.5mg  daily and followup in HTN clinic in 1 week for followup.   2. Chest pain - this has resolved and likely related to ETOH intake.  He has refused stress testing or Echo on 2 separate occasions and is not interested in any further testing.  I encouraged him to cut back on his ETOH intake and reiterated that alcohol is toxic to the heart. I will have him follow up with his PCP for this BP management as he does not want any further testing done for his heart.   Medication Adjustments/Labs and Tests Ordered: Current medicines are reviewed at length with the patient today.  Concerns regarding medicines are outlined above.  Medication changes, Labs and Tests ordered today are listed in the Patient Instructions below.  There are no Patient Instructions on file for this visit.   Signed, Armanda Magicraci Hasna Stefanik, MD  01/11/2016 3:21 PM    [facility] [address], [place], [place]  [address] Phone: [phone]; Fax: [phone]

## 2016-01-11 NOTE — Patient Instructions (Signed)
Medication Instructions:  1) INCREASE AMLODIPINE to 7.5 mg daily  Labwork: None  Testing/Procedures: None  Follow-Up: Your physician recommends that you schedule a follow-up appointment in 1 WEEK with the HTN CLINIC.   Your physician recommends that you schedule a follow-up appointment AS NEEDED with Dr. Mayford Knifeurner.  Any Other Special Instructions Will Be Listed Below (If Applicable).     If you need a refill on your cardiac medications before your next appointment, please call your pharmacy.

## 2016-01-15 ENCOUNTER — Ambulatory Visit: Payer: BLUE CROSS/BLUE SHIELD | Admitting: Pharmacist

## 2016-01-15 NOTE — Progress Notes (Deleted)
Patient ID: Nathan Stout                 DOB: 08/02/1965                      MRN: [id number]     HPI: Nathan Stout is a 50 y.o. male referred by Dr. Mayford Knifeurner to HTN clinic. PMH of HTN, HLD, CP, and EtOH abuse.   At last OV with Dr. Mayford Knifeurner, the patient's BP was 139/92 on amlodipine 5 mg daily, lisinopril 40 mg daily, and lopressor 50 mg BID. She increased his amlodipine to 7.5 mg daily in attempts to bring him to goal of <140/90 mmHg. His history is significant for alcohol abuse of which Dr. Mayford Knifeurner advised him to limit alcohol intake.   Encourage limiting alcohol intake and/or increase amlo to 10 mg  Current HTN meds: amlodipine 7.5 mg daily, lisinopril 40 mg daily, lopressor 50 mg BID,  Previously tried: amlodipine 5 mg daily BP goal: <140/90 mmHg  Family History: Stroke (father), HTN (brother)  Social History: Former smoker. Drinks 10 beers daily and denies illicit drug use.  Diet:   Exercise:   Home BP readings:   Wt Readings from Last 3 Encounters:  01/11/16 156 lb 12.8 oz (71.1 kg)  10/09/15 152 lb 1.9 oz (69 kg)  09/13/15 154 lb 12.8 oz (70.2 kg)   BP Readings from Last 3 Encounters:  01/11/16 (!) 139/92  10/09/15 118/78  09/13/15 (!) 150/100   Pulse Readings from Last 3 Encounters:  01/11/16 70  10/09/15 88  09/13/15 60    Renal function: CrCl cannot be calculated (Patient's most recent lab result is older than the maximum 21 days allowed.).  Past Medical History:  Diagnosis Date  . Alcohol abuse   . Benign essential HTN 08/16/2015  . HLD (hyperlipidemia)     Current Outpatient Prescriptions on File Prior to Visit  Medication Sig Dispense Refill  . amLODipine (NORVASC) 5 MG tablet Take 1.5 tablets (7.5 mg total) by mouth daily. 45 tablet 11  . atorvastatin (LIPITOR) 40 MG tablet TAKE 1 TABLET(40 MG) BY MOUTH DAILY 90 tablet 0  . lisinopril (PRINIVIL,ZESTRIL) 40 MG tablet Take 40 mg by mouth daily.     . metoprolol tartrate (LOPRESSOR) 50 MG tablet Take 1 tablet (50  mg total) by mouth 2 (two) times daily. 60 tablet 11   No current facility-administered medications on file prior to visit.     No Known Allergies   Assessment/Plan:  1. Hypertension -

## 2016-02-10 ENCOUNTER — Other Ambulatory Visit: Payer: Self-pay | Admitting: Family Medicine

## 2016-03-11 ENCOUNTER — Other Ambulatory Visit: Payer: Self-pay | Admitting: Family Medicine

## 2016-05-18 ENCOUNTER — Other Ambulatory Visit: Payer: Self-pay | Admitting: Family Medicine

## 2016-06-21 ENCOUNTER — Other Ambulatory Visit: Payer: Self-pay | Admitting: Urgent Care

## 2016-06-21 NOTE — Telephone Encounter (Signed)
Called to [facility] 

## 2016-08-15 ENCOUNTER — Other Ambulatory Visit: Payer: Self-pay | Admitting: Cardiology

## 2016-08-15 DIAGNOSIS — I10 Essential (primary) hypertension: Secondary | ICD-10-CM

## 2016-08-17 ENCOUNTER — Other Ambulatory Visit: Payer: Self-pay | Admitting: Cardiology

## 2016-08-17 ENCOUNTER — Other Ambulatory Visit: Payer: Self-pay | Admitting: Urgent Care

## 2017-02-13 ENCOUNTER — Other Ambulatory Visit: Payer: Self-pay | Admitting: Cardiology

## 2017-02-13 DIAGNOSIS — I10 Essential (primary) hypertension: Secondary | ICD-10-CM

## 2017-03-20 ENCOUNTER — Other Ambulatory Visit: Payer: Self-pay | Admitting: Cardiology

## 2017-03-20 DIAGNOSIS — I10 Essential (primary) hypertension: Secondary | ICD-10-CM

## 2017-08-20 IMAGING — DX DG CHEST 2V
2 series · 2 of 2 positions shown · non-contrast
Comparison: None.

CLINICAL DATA: Dyspnea

EXAM:
CHEST  2 VIEW

[chest pa]
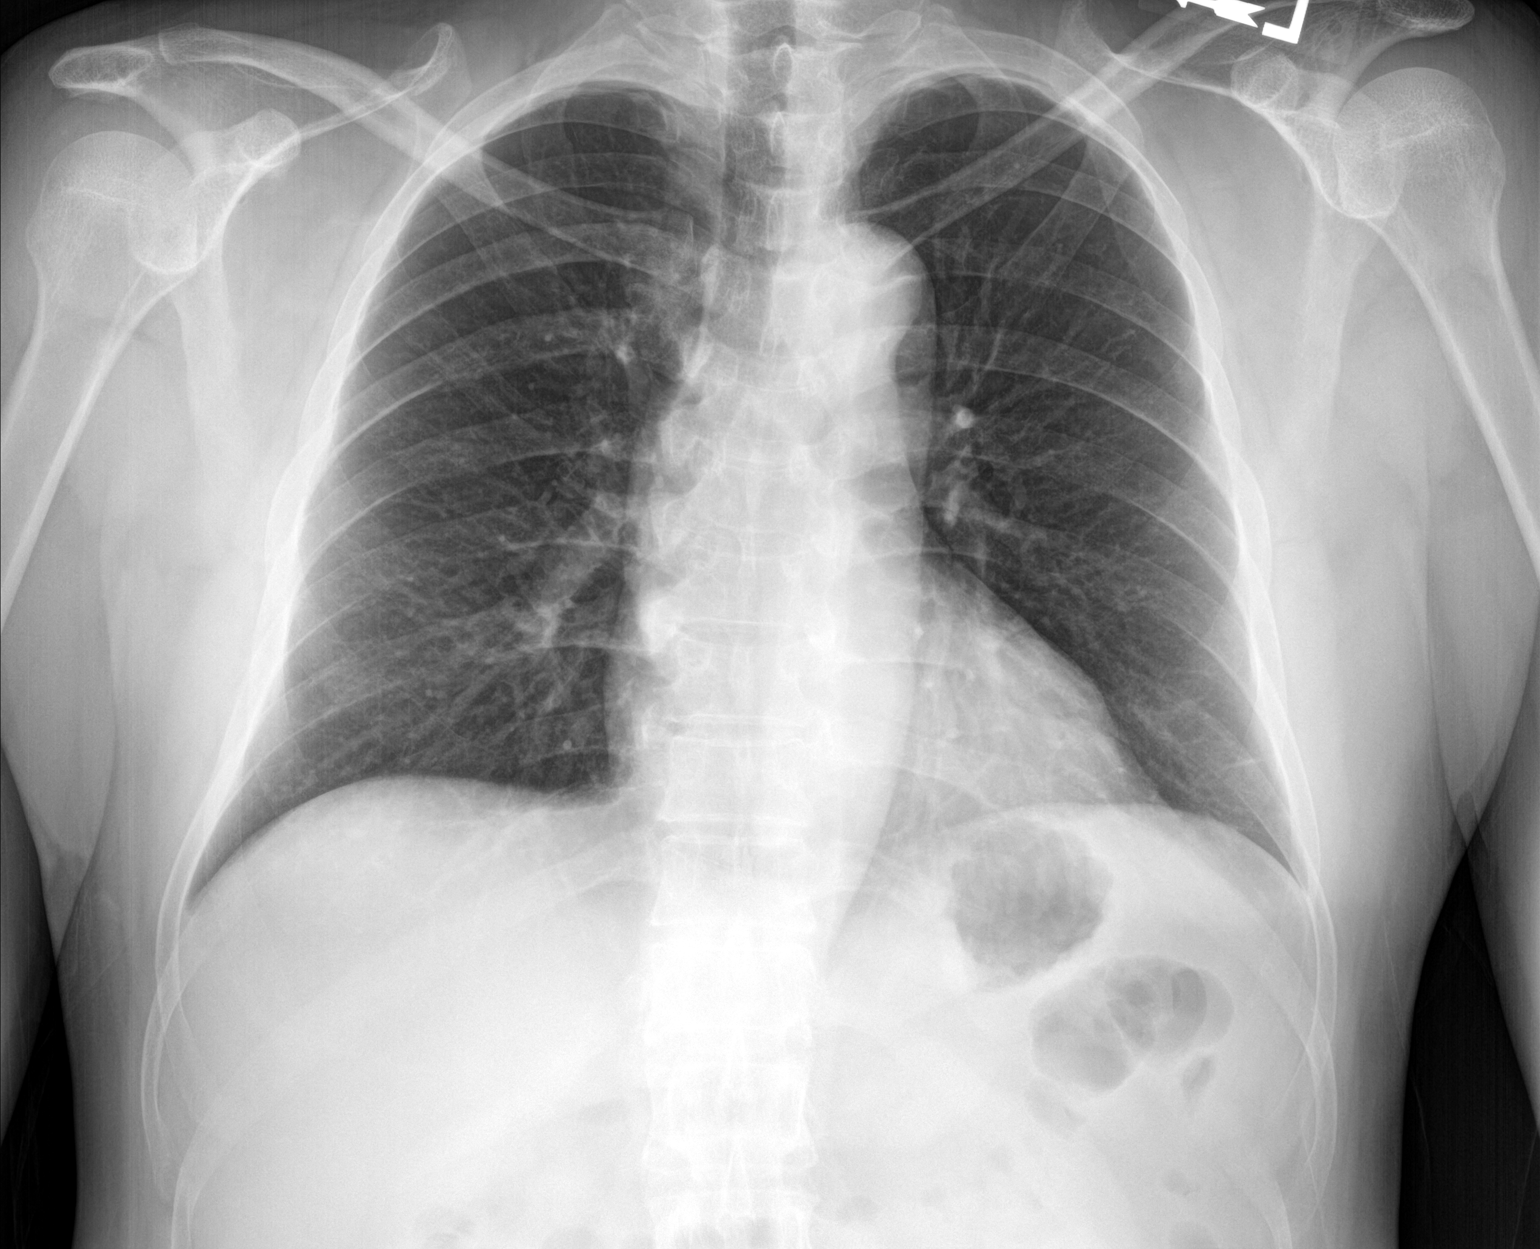

[chest lat]
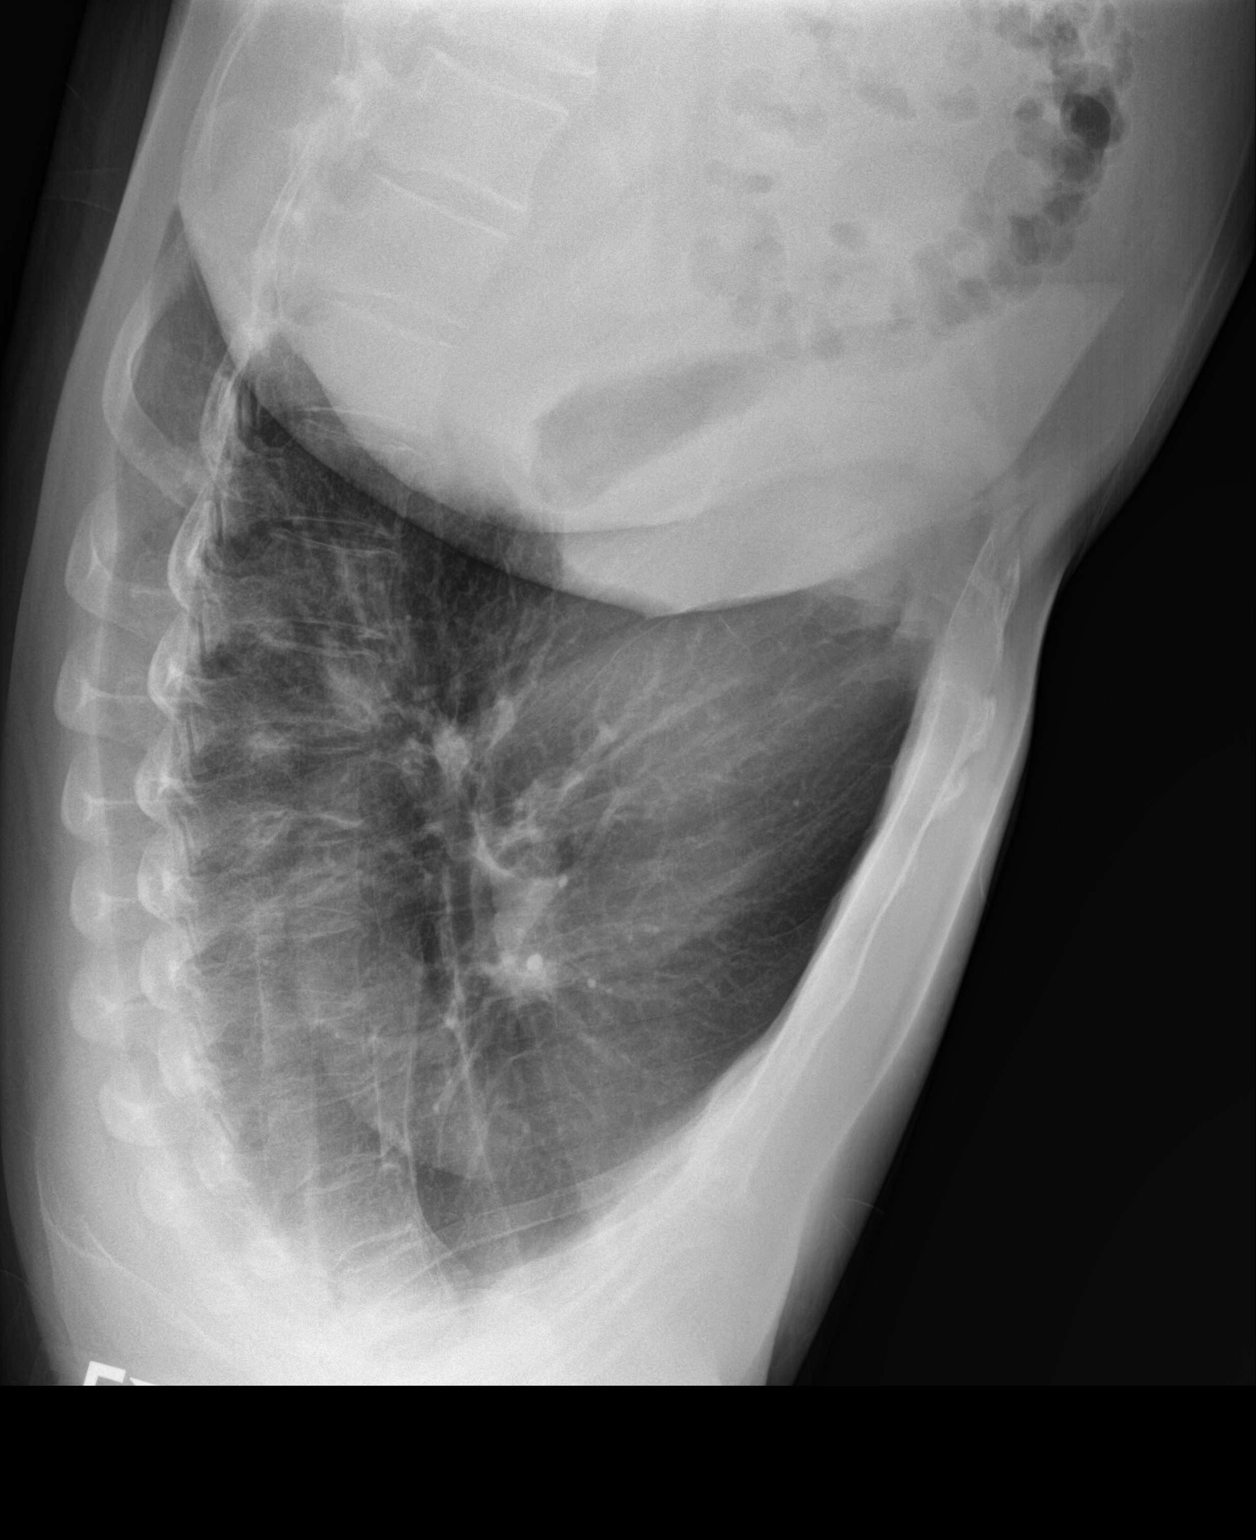

[2 of 2 positions shown; findings below may reference images not displayed]

FINDINGS: The heart size and mediastinal contours are within normal limits.
Both lungs are clear. The visualized skeletal structures are
unremarkable.
IMPRESSION: No active cardiopulmonary disease.

## 2018-08-12 ENCOUNTER — Other Ambulatory Visit: Payer: Self-pay

## 2018-08-12 ENCOUNTER — Encounter: Payer: Self-pay | Admitting: Family Medicine

## 2018-08-12 ENCOUNTER — Ambulatory Visit: Payer: BC Managed Care – PPO | Admitting: Family Medicine

## 2018-08-12 VITALS — BP 159/94 | HR 87 | Temp 98.6°F | Ht 65.0 in | Wt 158.0 lb

## 2018-08-12 DIAGNOSIS — I10 Essential (primary) hypertension: Secondary | ICD-10-CM

## 2018-08-12 DIAGNOSIS — E7800 Pure hypercholesterolemia, unspecified: Secondary | ICD-10-CM | POA: Diagnosis not present

## 2018-08-12 DIAGNOSIS — M10072 Idiopathic gout, left ankle and foot: Secondary | ICD-10-CM

## 2018-08-12 MED ORDER — METOPROLOL TARTRATE 50 MG PO TABS: 50.0000 mg | ORAL_TABLET | Freq: Two times a day (BID) | ORAL | 0 refills | Status: DC

## 2018-08-12 MED ORDER — ATORVASTATIN CALCIUM 40 MG PO TABS: ORAL_TABLET | ORAL | 0 refills | Status: DC

## 2018-08-12 MED ORDER — COLCHICINE 0.6 MG PO TABS: ORAL_TABLET | ORAL | 0 refills | Status: DC

## 2018-08-12 MED ORDER — LISINOPRIL 40 MG PO TABS: 40.0000 mg | ORAL_TABLET | Freq: Every day | ORAL | 0 refills | Status: DC

## 2018-08-12 NOTE — Patient Instructions (Addendum)
   Have labwork completed in 2 weeks -FASTING-we can not give you additional medication without blood work completed  Do not restart lipitor until you complete all your medications for gout-colchicine is completed. Do not take them at the same time  Restart your blood pressure medication-lisinopril and metoprolol-your blood pressure is to high  Make an appointment for 1 months for follow up(have labwork draw before your appt ) If you have lab work done today you will be contacted with your lab results within the next 2 weeks.  If you have not heard from [place] then please contact us. The fastest way to get your results is to register for My Chart.   IF you received an x-ray today, you will receive an invoice from [facility]. Please contact [facility] at [phone] with questions or concerns regarding your invoice.   IF you received labwork today, you will receive an invoice from [place]. Please contact [facility] at [phone] with questions or concerns regarding your invoice.   Our billing staff will not be able to assist you with questions regarding bills from these companies.  You will be contacted with the lab results as soon as they are available. The fastest way to get your results is to activate your My Chart account. Instructions are located on the last page of this paperwork. If you have not heard from [place] regarding the results in 2 weeks, please contact this office.

## 2018-08-12 NOTE — Progress Notes (Signed)
Acute Office Visit  Subjective:    Patient ID: Nathan Stout, male    DOB: 11-15-1965, 53 y.o.   MRN: [id number]  Chief Complaint  Patient presents with  . left gout like pain on foot    HPI Patient is in today for gout-recurrent flares in the past. Pt has never taken medication to prevent flares. Pt states flares come and go with foot pain sometimes so painful he uses crutches. Pt does not know what caused the flare. No injury to the left foot.  Painful and red.   HTN-pt needs refills on medication-pt is currently taking metoprolol but has run out of lisinpril. Pt has not completed labwork recently  Hyperlipidemia-pt has not taken lipitor recently-"ran out"  Past Medical History:  Diagnosis Date  . Alcohol abuse   . Benign essential HTN 08/16/2015  . HLD (hyperlipidemia)     No past surgical history on file.  Family History  Problem Relation Age of Onset  . Stroke Father   . Hypertension Brother     Social History   Socioeconomic History  . Marital status: Married    Spouse name: Not on file  . Number of children: Not on file  . Years of education: Not on file  . Highest education level: Not on file  Occupational History  . Occupation: Dealer  Social Needs  . Financial resource strain: Not on file  . Food insecurity    Worry: Not on file    Inability: Not on file  . Transportation needs    Medical: Not on file    Non-medical: Not on file  Tobacco Use  . Smoking status: Former Research scientist (life sciences)  . Smokeless tobacco: Former Network engineer and Sexual Activity  . Alcohol use: No    Alcohol/week: 0.0 standard drinks  . Drug use: Yes    Comment: 10 beers daily - start drinking after work  . Sexual activity: Yes  Lifestyle  . Physical activity    Days per week: Not on file    Minutes per session: Not on file  . Stress: Not on file  Relationships  . Social Herbalist on phone: Not on file    Gets together: Not on file    Attends religious service: Not on  file    Active member of club or organization: Not on file    Attends meetings of clubs or organizations: Not on file    Relationship status: Not on file  . Intimate partner violence    Fear of current or ex partner: Not on file    Emotionally abused: Not on file    Physically abused: Not on file    Forced sexual activity: Not on file  Other Topics Concern  . Not on file  Social History Narrative   Married - [name]   2 daughter   Work - Surveyor, quantity   Exercise - none outside of work   From [place] - here since 1987    Outpatient Medications Prior to Visit  Medication Sig Dispense Refill  . amLODipine (NORVASC) 5 MG tablet Take 1.5 tablets (7.5 mg total) by mouth daily. (Patient not taking: Reported on 08/12/2018) 45 tablet 11  . atorvastatin (LIPITOR) 40 MG tablet One per day Needs office visit (Patient not taking: Reported on 08/12/2018) 15 tablet 0  . lisinopril (PRINIVIL,ZESTRIL) 10 MG tablet TAKE 1 TABLET(10 MG) BY MOUTH DAILY (Patient not taking: Reported on 08/12/2018) 90 tablet 0  . lisinopril (PRINIVIL,ZESTRIL) 40 MG  tablet Take 40 mg by mouth daily.     . metoprolol tartrate (LOPRESSOR) 50 MG tablet TAKE 1 TABLET BY MOUTH TWICE DAILY. PLEASE SCHEDULE AN APPOINTMENT FOR ANYMORE REFILLS. (Patient not taking: Reported on 08/12/2018) 30 tablet 0   No facility-administered medications prior to visit.     No Known Allergies  Review of Systems  Constitutional: Negative for malaise/fatigue.  Cardiovascular: Negative for chest pain, palpitations and leg swelling.  Musculoskeletal: Positive for joint pain.       Objective:    Physical Exam  Constitutional: He appears well-developed and well-nourished. No distress.  Eyes: Conjunctivae are normal.  Neck: Normal range of motion. Neck supple.  Cardiovascular: Normal rate and regular rhythm.  Pulmonary/Chest: Effort normal and breath sounds normal.  Musculoskeletal:        General: Tenderness and edema present.  Skin: There is  erythema.  left dorsal foot-eryth, swelling,tenderness to touch  BP (!) 159/94 (BP Location: Right Arm, Patient Position: Sitting, Cuff Size: Normal)   Pulse 87   Temp 98.6 F (37 C) (Oral)   Ht _0  (1.651 m)   Wt 158 lb (71.7 kg)   SpO2 95%   BMI 26.29 kg/m  Wt Readings from Last 3 Encounters:  08/12/18 158 lb (71.7 kg)  01/11/16 156 lb 12.8 oz (71.1 kg)  10/09/15 152 lb 1.9 oz (69 kg)    Health Maintenance Due  Topic Date Due  . COLONOSCOPY  04/11/2014   Lab Results  Component Value Date   TSH 2.86 05/13/2015   Lab Results  Component Value Date   WBC 5.7 05/13/2015   HGB 16.7 05/13/2015   HCT 48.1 05/13/2015   MCV 91.8 05/13/2015   PLT 291 05/13/2015   Lab Results  Component Value Date   NA 137 09/13/2015   K 4.3 09/13/2015   CO2 24 09/13/2015   GLUCOSE 85 09/13/2015   BUN 12 09/13/2015   CREATININE 0.85 09/13/2015   BILITOT 0.7 08/13/2015   ALKPHOS 88 08/13/2015   AST 76 (H) 08/13/2015   ALT 85 (H) 08/13/2015   PROT 8.4 (H) 08/13/2015   ALBUMIN 4.4 08/13/2015   CALCIUM 9.4 09/13/2015   Lab Results  Component Value Date   CHOL 301 (H) 05/13/2015   Lab Results  Component Value Date   HDL 70 05/13/2015   Lab Results  Component Value Date   LDLCALC 179 (H) 05/13/2015   Lab Results  Component Value Date   TRIG 258 (H) 05/13/2015   Lab Results  Component Value Date   CHOLHDL 4.3 05/13/2015      Assessment & Plan:   Problem List Items Addressed This Visit    None      1. Essential hypertension  metoprolol tartrate (LOPRESSOR) 50 MG tablet; Take 1 tablet (50 mg total) by mouth 2 (two) times daily.  Dispense: 60 tablet; Refill: 0 Lisinopril-restart medication-blood pressure elevated off medication - CMP14+EGFR; Future - TSH; Future - POCT urinalysis dipstick - Lipid panel; Future - Uric Acid; Future  2. Elevated cholesterol D/w pt need for fasting blood work-hold lipitor until gout flare resolved then restart medication. No recent  baseline LFT. Pt with h/o alcohol use.  3. Acute idiopathic gout of left foot Colchicine -rx-risk/benefit/side effects, elevation, ice LISA Hannah Beat, MD

## 2018-08-17 ENCOUNTER — Telehealth: Payer: Self-pay | Admitting: Family Medicine

## 2018-08-17 NOTE — Telephone Encounter (Unsigned)
Copied from [place] [id number]. Topic: Quick Communication - Rx Refill/Question >> Aug 17, 2018 11:09 AM Alanda Slim E wrote: Medication: colchicine 0.6 MG tablet - Pt feels that 0.6mg  is not strong enoungh and feels that he needs about 300mg . He stated his brother has the same med and its 300mg  and it helps better/ please advise

## 2018-08-17 NOTE — Telephone Encounter (Signed)
Spoke with pt and informed him to give [place] a call when he know what medication he needs for gout, he verbalized understanding.

## 2018-08-19 ENCOUNTER — Telehealth: Payer: Self-pay | Admitting: Family Medicine

## 2018-08-19 NOTE — Telephone Encounter (Signed)
Pt was denied the medication Colcrys. The insurance states that the only way it would be approved is if the patient has tried and failed the medication Mitigare capsules.

## 2018-08-20 ENCOUNTER — Telehealth: Payer: Self-pay

## 2018-08-20 NOTE — Telephone Encounter (Signed)
This pt was seen by Dr. Holly Bodily and was told to bring the medication in to the office that he was taking for Gout Flair up and he came into the office today needing a refill on Allopurinol 300 mg and Indomethacin 50 mg. Informed pt I will send provider a message about refills. Please advise

## 2018-08-21 NOTE — Telephone Encounter (Signed)
I do not see where allopurinol has been prescribed and referring to Dr. Charlestine Massed note - needs bloodwork prior to other meds. Plan for labs in 2 weeks. Recommend in office discussion once we have those labs to determine if allopurinol may be helpful.  Should not take that now as may worsen recent flair.

## 2018-08-26 ENCOUNTER — Ambulatory Visit: Payer: BC Managed Care – PPO | Admitting: Family Medicine

## 2018-08-26 ENCOUNTER — Telehealth: Payer: Self-pay | Admitting: Family Medicine

## 2018-08-26 NOTE — Telephone Encounter (Signed)
Copied from [place] [phone]. Topic: General - Other >> Aug 25, 2018  5:59 PM Ivar Drape wrote: Reason for CRM:   Patient would like a call back to reschedule his 08/26/2018 appt at 8:00am.  He can't make it.

## 2018-08-31 ENCOUNTER — Ambulatory Visit: Payer: BC Managed Care – PPO | Admitting: Family Medicine

## 2018-08-31 ENCOUNTER — Encounter: Payer: Self-pay | Admitting: Family Medicine

## 2018-08-31 ENCOUNTER — Other Ambulatory Visit: Payer: Self-pay

## 2018-08-31 VITALS — BP 135/87 | HR 70 | Temp 98.6°F | Ht 65.0 in | Wt 157.0 lb

## 2018-08-31 DIAGNOSIS — M10071 Idiopathic gout, right ankle and foot: Secondary | ICD-10-CM | POA: Diagnosis not present

## 2018-08-31 DIAGNOSIS — I10 Essential (primary) hypertension: Secondary | ICD-10-CM

## 2018-08-31 LAB — POCT URINALYSIS DIP (MANUAL ENTRY)
Bilirubin, UA: NEGATIVE
Blood, UA: NEGATIVE
Glucose, UA: NEGATIVE mg/dL
Ketones, POC UA: NEGATIVE mg/dL
Leukocytes, UA: NEGATIVE
Nitrite, UA: NEGATIVE
Protein Ur, POC: NEGATIVE mg/dL
Spec Grav, UA: 1.02 (ref 1.010–1.025)
Urobilinogen, UA: 0.2 E.U./dL
pH, UA: 5.5 (ref 5.0–8.0)

## 2018-08-31 MED ORDER — INDOMETHACIN 50 MG PO CAPS: 50.0000 mg | ORAL_CAPSULE | Freq: Three times a day (TID) | ORAL | 1 refills | Status: DC | PRN

## 2018-08-31 NOTE — Progress Notes (Signed)
Acute Office Visit  Subjective:    Patient ID: Nathan Stout, male    DOB: 1965-05-03, 53 y.o.   MRN: [id number]  Chief Complaint  Patient presents with  . Gout    f/u     HPI Patient is in today for blood work. Pt states gout flares every 3 months-last one in his foot.  Pt states brother has gout and used indomethacin  for flares. Pt states colchicine was to expensive. Pt would like allopurinol for treatment to keep from getting gout. Pt admits her drinks alcohol and understands this can be a trigger  Past Medical History:  Diagnosis Date  . Alcohol abuse   . Benign essential HTN 08/16/2015  . HLD (hyperlipidemia)     No past surgical history on file.  Family History  Problem Relation Age of Onset  . Stroke Father   . Hypertension Brother     Social History   Socioeconomic History  . Marital status: Married    Spouse name: Not on file  . Number of children: Not on file  . Years of education: Not on file  . Highest education level: Not on file  Occupational History  . Occupation: Dealer  Social Needs  . Financial resource strain: Not on file  . Food insecurity    Worry: Not on file    Inability: Not on file  . Transportation needs    Medical: Not on file    Non-medical: Not on file  Tobacco Use  . Smoking status: Former Research scientist (life sciences)  . Smokeless tobacco: Former Network engineer and Sexual Activity  . Alcohol use: No    Alcohol/week: 0.0 standard drinks  . Drug use: Yes    Comment: 10 beers daily - start drinking after work  . Sexual activity: Yes  Lifestyle  . Physical activity    Days per week: Not on file    Minutes per session: Not on file  . Stress: Not on file  Relationships  . Social Herbalist on phone: Not on file    Gets together: Not on file    Attends religious service: Not on file    Active member of club or organization: Not on file    Attends meetings of clubs or organizations: Not on file    Relationship status: Not on file  .  Intimate partner violence    Fear of current or ex partner: Not on file    Emotionally abused: Not on file    Physically abused: Not on file    Forced sexual activity: Not on file  Other Topics Concern  . Not on file  Social History Narrative   Married - [name]   2 daughter   Work - Surveyor, quantity   Exercise - none outside of work   From [place] - here since 1987    Outpatient Medications Prior to Visit  Medication Sig Dispense Refill  . lisinopril (ZESTRIL) 40 MG tablet Take 1 tablet (40 mg total) by mouth daily. 30 tablet 0  . metoprolol tartrate (LOPRESSOR) 50 MG tablet Take 1 tablet (50 mg total) by mouth 2 (two) times daily. 60 tablet 0  . atorvastatin (LIPITOR) 40 MG tablet One per day (Patient not taking: Reported on 08/31/2018) 30 tablet 0  . colchicine 0.6 MG tablet Take 2 today with food then 1 tonight with food then 1 po BID until symptoms resolve (Patient not taking: Reported on 08/31/2018) 14 tablet 0   No facility-administered medications prior  to visit.     No Known Allergies  Review of Systems  Musculoskeletal: Positive for joint pain.       Objective:    Physical Exam  Constitutional: He is oriented to person, place, and time. He appears well-developed and well-nourished. No distress.  Musculoskeletal: Normal range of motion.        General: Tenderness present. No deformity or edema.  Neurological: He is alert and oriented to person, place, and time.  left foot shows no eryth or edema  BP 135/87 (BP Location: Right Arm, Patient Position: Sitting, Cuff Size: Normal)   Pulse 70   Temp 98.6 F (37 C) (Oral)   Ht 5' 5"  (1.651 m)   Wt 157 lb (71.2 kg)   SpO2 96%   BMI 26.13 kg/m  Wt Readings from Last 3 Encounters:  08/31/18 157 lb (71.2 kg)  08/12/18 158 lb (71.7 kg)  01/11/16 156 lb 12.8 oz (71.1 kg)    Health Maintenance Due  Topic Date Due  . COLONOSCOPY  04/11/2014    Lab Results  Component Value Date   TSH 2.86 05/13/2015   Lab Results   Component Value Date   WBC 5.7 05/13/2015   HGB 16.7 05/13/2015   HCT 48.1 05/13/2015   MCV 91.8 05/13/2015   PLT 291 05/13/2015   Lab Results  Component Value Date   NA 137 09/13/2015   K 4.3 09/13/2015   CO2 24 09/13/2015   GLUCOSE 85 09/13/2015   BUN 12 09/13/2015   CREATININE 0.85 09/13/2015   BILITOT 0.7 08/13/2015   ALKPHOS 88 08/13/2015   AST 76 (H) 08/13/2015   ALT 85 (H) 08/13/2015   PROT 8.4 (H) 08/13/2015   ALBUMIN 4.4 08/13/2015   CALCIUM 9.4 09/13/2015   Lab Results  Component Value Date   CHOL 301 (H) 05/13/2015   Lab Results  Component Value Date   HDL 70 05/13/2015   Lab Results  Component Value Date   LDLCALC 179 (H) 05/13/2015   Lab Results  Component Value Date   TRIG 258 (H) 05/13/2015   Lab Results  Component Value Date   CHOLHDL 4.3 05/13/2015       Assessment & Plan:  1. Acute idiopathic gout of right foot Most recently left foot-uric acid, cmp-will rx allopurinol if uric acid elevated-+FH gout with use of allopurinol and indomethacin understands risk/benefit /side effects  2. Essential hypertension Stable-lisinopril/metoprolol - POCT urinalysis dipstick - Uric Acid - Lipid panel - TSH - CMP14+EGFR LISA Hannah Beat, MD

## 2018-08-31 NOTE — Patient Instructions (Addendum)
     If you have lab work done today you will be contacted with your lab results within the next 2 weeks.  If you have not heard from [place] then please contact us. The fastest way to get your results is to register for My Chart. Pt to try indocin for pain related to gout We will call on results of labwork-if uric acid is elevated -start allopurinol -this new medication will be sent to your pharmacy  IF you received an x-ray today, you will receive an invoice from [facility]. Please contact [facility] at [phone] with questions or concerns regarding your invoice.   IF you received labwork today, you will receive an invoice from [place]. Please contact [facility] at [phone] with questions or concerns regarding your invoice.   Our billing staff will not be able to assist you with questions regarding bills from these companies.  You will be contacted with the lab results as soon as they are available. The fastest way to get your results is to activate your My Chart account. Instructions are located on the last page of this paperwork. If you have not heard from [place] regarding the results in 2 weeks, please contact this office.

## 2018-09-01 LAB — TSH: TSH: 3.24 u[IU]/mL (ref 0.450–4.500)

## 2018-09-01 LAB — CMP14+EGFR
ALT: 49 IU/L — ABNORMAL HIGH (ref 0–44)
AST: 53 IU/L — ABNORMAL HIGH (ref 0–40)
Albumin/Globulin Ratio: 1.2 (ref 1.2–2.2)
Albumin: 4.7 g/dL (ref 3.8–4.9)
Alkaline Phosphatase: 79 IU/L (ref 39–117)
BUN/Creatinine Ratio: 19 (ref 9–20)
BUN: 15 mg/dL (ref 6–24)
Bilirubin Total: 0.6 mg/dL (ref 0.0–1.2)
CO2: 24 mmol/L (ref 20–29)
Calcium: 9.9 mg/dL (ref 8.7–10.2)
Chloride: 99 mmol/L (ref 96–106)
Creatinine, Ser: 0.79 mg/dL (ref 0.76–1.27)
GFR calc Af Amer: 118 mL/min/{1.73_m2} (ref >59)
GFR calc non Af Amer: 102 mL/min/{1.73_m2} (ref >59)
Globulin, Total: 3.8 g/dL (ref 1.5–4.5)
Glucose: 124 mg/dL — ABNORMAL HIGH (ref 65–99)
Potassium: 4.8 mmol/L (ref 3.5–5.2)
Sodium: 136 mmol/L (ref 134–144)
Total Protein: 8.5 g/dL (ref 6.0–8.5)

## 2018-09-01 LAB — LIPID PANEL
Chol/HDL Ratio: 4 ratio (ref 0.0–5.0)
Cholesterol, Total: 251 mg/dL — ABNORMAL HIGH (ref 100–199)
HDL: 63 mg/dL (ref >39)
LDL Calculated: 135 mg/dL — ABNORMAL HIGH (ref 0–99)
Triglycerides: 263 mg/dL — ABNORMAL HIGH (ref 0–149)
VLDL Cholesterol Cal: 53 mg/dL — ABNORMAL HIGH (ref 5–40)

## 2018-09-01 LAB — URIC ACID: Uric Acid: 6.6 mg/dL (ref 3.7–8.6)

## 2018-09-14 ENCOUNTER — Other Ambulatory Visit: Payer: Self-pay | Admitting: Family Medicine

## 2018-09-14 DIAGNOSIS — I10 Essential (primary) hypertension: Secondary | ICD-10-CM

## 2018-09-14 MED ORDER — METOPROLOL TARTRATE 50 MG PO TABS: 50.0000 mg | ORAL_TABLET | Freq: Two times a day (BID) | ORAL | 0 refills | Status: DC

## 2018-09-22 NOTE — Progress Notes (Signed)
Spoke with patient and he understood the lab msg and will call back to schedule an appt to f/u for his elevated glucose and recheck his liver function.

## 2018-09-23 ENCOUNTER — Other Ambulatory Visit: Payer: Self-pay

## 2018-09-23 MED ORDER — LISINOPRIL 40 MG PO TABS: 40.0000 mg | ORAL_TABLET | Freq: Every day | ORAL | 0 refills | Status: DC

## 2018-09-30 ENCOUNTER — Encounter: Payer: Self-pay | Admitting: Family Medicine

## 2018-09-30 ENCOUNTER — Ambulatory Visit: Payer: BC Managed Care – PPO | Admitting: Family Medicine

## 2018-09-30 ENCOUNTER — Other Ambulatory Visit: Payer: Self-pay

## 2018-09-30 VITALS — BP 182/98 | HR 70 | Temp 98.7°F | Ht 65.0 in | Wt 157.4 lb

## 2018-09-30 DIAGNOSIS — R7309 Other abnormal glucose: Secondary | ICD-10-CM | POA: Insufficient documentation

## 2018-09-30 DIAGNOSIS — Z1212 Encounter for screening for malignant neoplasm of rectum: Secondary | ICD-10-CM

## 2018-09-30 DIAGNOSIS — R350 Frequency of micturition: Secondary | ICD-10-CM

## 2018-09-30 DIAGNOSIS — E7800 Pure hypercholesterolemia, unspecified: Secondary | ICD-10-CM | POA: Diagnosis not present

## 2018-09-30 DIAGNOSIS — Z1211 Encounter for screening for malignant neoplasm of colon: Secondary | ICD-10-CM

## 2018-09-30 DIAGNOSIS — E0800 Diabetes mellitus due to underlying condition with hyperosmolarity without nonketotic hyperglycemic-hyperosmolar coma (NKHHC): Secondary | ICD-10-CM | POA: Insufficient documentation

## 2018-09-30 DIAGNOSIS — I10 Essential (primary) hypertension: Secondary | ICD-10-CM

## 2018-09-30 LAB — POCT URINALYSIS DIP (MANUAL ENTRY)
Bilirubin, UA: NEGATIVE
Blood, UA: NEGATIVE
Glucose, UA: NEGATIVE mg/dL
Ketones, POC UA: NEGATIVE mg/dL
Leukocytes, UA: NEGATIVE
Nitrite, UA: NEGATIVE
Protein Ur, POC: NEGATIVE mg/dL
Spec Grav, UA: 1.02 (ref 1.010–1.025)
Urobilinogen, UA: 0.2 E.U./dL
pH, UA: 6 (ref 5.0–8.0)

## 2018-09-30 LAB — POC MICROSCOPIC URINALYSIS (UMFC): Mucus: ABSENT

## 2018-09-30 LAB — POCT GLYCOSYLATED HEMOGLOBIN (HGB A1C): Hemoglobin A1C: 6.7 % — AB (ref 4.0–5.6)

## 2018-09-30 MED ORDER — ATORVASTATIN CALCIUM 40 MG PO TABS: ORAL_TABLET | ORAL | 1 refills | Status: DC

## 2018-09-30 NOTE — Progress Notes (Signed)
Established Patient Office Visit  Subjective:  Patient ID: Nathan Stout, male    DOB: 1965-04-12  Age: 53 y.o. MRN: [id number]  CC:  Chief Complaint  Patient presents with  . Follow-up    here to have diabtic testing done, having nocturia and urgency    HPI [name] presents for elevated glucose, elevated LFT and urgency.  Pt admits to drinking beer-greater than 2 drinks. Pt states he also was drinking hard liquor. Pt with recent visual changes-no eye exam for 4 years. Pt with no foot concerns-no numbness. Pt states nocturia-no blood. No difficulty with urination. Pt drinks tea at night-unsure if caffeine.  Pt has not been taking Lipitor.  Past Medical History:  Diagnosis Date  . Alcohol abuse   . Benign essential HTN 08/16/2015  . HLD (hyperlipidemia)   Quit smoking over 15 years ago No FH DM-mother living, father died CV History reviewed. No pertinent surgical history.  Family History  Problem Relation Age of Onset  . Stroke Father   . Hypertension Brother     Social History   Socioeconomic History  . Marital status: Married    Spouse name: Not on file  . Number of children: Not on file  . Years of education: Not on file  . Highest education level: Not on file  Occupational History  . Occupation: Dealer  Social Needs  . Financial resource strain: Not on file  . Food insecurity    Worry: Not on file    Inability: Not on file  . Transportation needs    Medical: Not on file    Non-medical: Not on file  Tobacco Use  . Smoking status: Former Research scientist (life sciences)  . Smokeless tobacco: Former Network engineer and Sexual Activity  . Alcohol use: No    Alcohol/week: 0.0 standard drinks  . Drug use: Yes    Comment: 10 beers daily - start drinking after work  . Sexual activity: Yes  Lifestyle  . Physical activity    Days per week: Not on file    Minutes per session: Not on file  . Stress: Not on file  Relationships  . Social Herbalist on phone: Not on file    Gets  together: Not on file    Attends religious service: Not on file    Active member of club or organization: Not on file    Attends meetings of clubs or organizations: Not on file    Relationship status: Not on file  . Intimate partner violence    Fear of current or ex partner: Not on file    Emotionally abused: Not on file    Physically abused: Not on file    Forced sexual activity: Not on file  Other Topics Concern  . Not on file  Social History Narrative   Married - Nathan Stout   2 daughter   Work - Surveyor, quantity   Exercise - none outside of work   From [place] - here since 1987    Outpatient Medications Prior to Visit  Medication Sig Dispense Refill  . indomethacin (INDOCIN) 50 MG capsule Take 1 capsule (50 mg total) by mouth 3 (three) times daily as needed for moderate pain. 30 capsule 1  . lisinopril (ZESTRIL) 40 MG tablet Take 1 tablet (40 mg total) by mouth daily. 30 tablet 0  . metoprolol tartrate (LOPRESSOR) 50 MG tablet Take 1 tablet (50 mg total) by mouth 2 (two) times daily. 60 tablet 0  . atorvastatin (LIPITOR)  40 MG tablet One per day (Patient not taking: Reported on 09/30/2018) 30 tablet 0  . colchicine 0.6 MG tablet Take 2 today with food then 1 tonight with food then 1 po BID until symptoms resolve (Patient not taking: Reported on 09/30/2018) 14 tablet 0   No facility-administered medications prior to visit.     No Known Allergies  ROS Review of Systems  Genitourinary: Positive for urgency. Negative for difficulty urinating and hematuria.      Objective:    Physical Exam  Constitutional: He is oriented to person, place, and time. He appears well-developed and well-nourished.  Neurological: He is alert and oriented to person, place, and time.    BP (!) 182/98   Pulse 70   Temp 98.7 F (37.1 C)   Ht 5\' 5"  (1.651 m)   Wt 157 lb 6.4 oz (71.4 kg)   SpO2 98%   BMI 26.19 kg/m  Wt Readings from Last 3 Encounters:  09/30/18 157 lb 6.4 oz (71.4 kg)  08/31/18 157 lb  (71.2 kg)  08/12/18 158 lb (71.7 kg)     Health Maintenance Due  Topic Date Due  . COLONOSCOPY  04/11/2014   Lab Results  Component Value Date   TSH 3.240 08/31/2018   Lab Results  Component Value Date   WBC 5.7 05/13/2015   HGB 16.7 05/13/2015   HCT 48.1 05/13/2015   MCV 91.8 05/13/2015   PLT 291 05/13/2015   Lab Results  Component Value Date   NA 136 08/31/2018   K 4.8 08/31/2018   CO2 24 08/31/2018   GLUCOSE 124 (H) 08/31/2018   BUN 15 08/31/2018   CREATININE 0.79 08/31/2018   BILITOT 0.6 08/31/2018   ALKPHOS 79 08/31/2018   AST 53 (H) 08/31/2018   ALT 49 (H) 08/31/2018   PROT 8.5 08/31/2018   ALBUMIN 4.7 08/31/2018   CALCIUM 9.9 08/31/2018   Lab Results  Component Value Date   CHOL 251 (H) 08/31/2018   Lab Results  Component Value Date   HDL 63 08/31/2018   Lab Results  Component Value Date   LDLCALC 135 (H) 08/31/2018   Lab Results  Component Value Date   TRIG 263 (H) 08/31/2018   Lab Results  Component Value Date   CHOLHDL 4.0 08/31/2018     Assessment & Plan:   1. Screening for colorectal cancer D/w pt importance-no FH colon CA - Cologuard  2. Elevated cholesterol Pt understand CV risk-+FH, DM, HTN-will restart lipitor  3. Essential hypertension Takes zestril daily-d/w pt importance of taking bp medication and monitoring blood pressure, avoid sodium in diet. Goal of 130/80. Pt did not take medication this morning.  4. Elevated glucose - POCT glycosylated hemoglobin (Hb A1C)  5. Frequency of urination Avoid caffeine at night-carbs 2/meal, alcohol max 2/day - POCT urinalysis dipstick - POCT Microscopic Urinalysis ([facility])  6. Diabetes mellitus due to underlying condition with hyperosmolarity without coma, without long-term current use of insulin (HCC) Pt with new diagnosis DM-drinks alcohol-more than 2 drinks /day-encouraged to limit with elevated LFT and newly diagnosed DM, foot exam noted callous, eye exam referral made. No medication  started-pt to limit alcohol and carbohydrates. Explained importance of taking medication daily for CV risk.  Pt understands importance. F/u in 6 months for repeat labwork and exam.  CODE FOR TIME 45 minutes used to discuss newly diagnosed DM, risk of CV disease-limitation of carbohydrates and alcohol specifically.  Greater than 50% of ov used to discuss this new diagnosis and  importance of continued follow up, testing, diet changes, changes in alcohol use, foot care and taking medications. Limit alcohol intake-d/w pt at length Analilia Geddis Mat CarneLEIGH Marguita Venning, MD

## 2018-09-30 NOTE — Patient Instructions (Addendum)
Diabetic test 6.7%-this means you now have DIABETES Urine normal Limit alcohol to 2 drinks/day-concern for slightly elevated liver test Continue drinking plenty of water Limit tea at night to avoid bladder irritation We will schedule you an appointment to have your eyes examined Please make an appointment to be rechecked in 6 months-labwork and office visit Take good care of your feet-always wear shoes -even in the house      If you have lab work done today you will be contacted with your lab results within the next 2 weeks.  If you have not heard from [place] then please contact us. The fastest way to get your results is to register for My Chart.   IF you received an x-ray today, you will receive an invoice from [facility]. Please contact [facility] at [phone] with questions or concerns regarding your invoice.   IF you received labwork today, you will receive an invoice from [facility]. Please contact [facility] at [phone] with questions or concerns regarding your invoice.   Our billing staff will not be able to assist you with questions regarding bills from these companies.  You will be contacted with the lab results as soon as they are available. The fastest way to get your results is to activate your My Chart account. Instructions are located on the last page of this paperwork. If you have not heard from [place] regarding the results in 2 weeks, please contact this office.

## 2018-10-26 ENCOUNTER — Other Ambulatory Visit: Payer: Self-pay

## 2018-11-02 ENCOUNTER — Other Ambulatory Visit: Payer: Self-pay | Admitting: Family Medicine

## 2018-11-02 DIAGNOSIS — I10 Essential (primary) hypertension: Secondary | ICD-10-CM

## 2018-11-02 MED ORDER — LISINOPRIL 40 MG PO TABS: 40.0000 mg | ORAL_TABLET | Freq: Every day | ORAL | 0 refills | Status: DC

## 2018-11-02 NOTE — Telephone Encounter (Signed)
Requested medication (s) are due for refill today: yes  Requested medication (s) are on the active medication list:yes  Last refill:  10/2018  Future visit scheduled: yes  Notes to clinic: ordering provider and pcp are different  Requested Prescriptions  Pending Prescriptions Disp Refills   lisinopril (ZESTRIL) 40 MG tablet 30 tablet 0    Sig: Take 1 tablet (40 mg total) by mouth daily.     Cardiovascular:  ACE Inhibitors Failed - 11/02/2018  9:40 AM      Failed - Last BP in normal range    BP Readings from Last 1 Encounters:  09/30/18 (!) 182/98         Passed - Cr in normal range and within 180 days    Creat  Date Value Ref Range Status  09/13/2015 0.85 0.70 - 1.33 mg/dL Final    Comment:      For patients > or = 54 years of age: The upper reference limit for Creatinine is approximately 13% higher for people identified as African-American.      Creatinine, Ser  Date Value Ref Range Status  08/31/2018 0.79 0.76 - 1.27 mg/dL Final         Passed - K in normal range and within 180 days    Potassium  Date Value Ref Range Status  08/31/2018 4.8 3.5 - 5.2 mmol/L Final         Passed - Patient is not pregnant      Passed - Valid encounter within last 6 months    Recent Outpatient Visits          1 month ago Screening for colorectal cancer   Primary Care at [place], MD   2 months ago Acute idiopathic gout of right foot   Primary Care at [place], MD   2 months ago Elevated cholesterol   Primary Care at [facility], Rex Kras, MD   3 years ago Essential hypertension   [facility] at [place], Ranell Patrick, MD   3 years ago Annual physical exam   Primary Care at [facility], Damaris Hippo, PA-C      Future Appointments            In 4 months Nathan Raspberry Ranell Patrick, MD Primary Care at [place], [facility]

## 2018-11-02 NOTE — Telephone Encounter (Signed)
Medication Refill - Medication: lisinopril (ZESTRIL) 40 MG tablet   Preferred Pharmacy (with phone number or street name):  [facility] [place], [place] [phone] (Phone) [phone] (Fax)

## 2018-11-02 NOTE — Telephone Encounter (Signed)
Requested medication (s) are due for refill today: yes  Requested medication (s) are on the active medication list:yes  Last refill:  11/02/2018 for 30 day Future visit scheduled: yes  Notes to clinic:  Patient requests 90 days supply   Requested Prescriptions  Pending Prescriptions Disp Refills   lisinopril (ZESTRIL) 40 MG tablet [Pharmacy Med Name: LISINOPRIL 40MG  TABLETS] 90 tablet     Sig: TAKE 1 TABLET(40 MG) BY MOUTH DAILY     Cardiovascular:  ACE Inhibitors Failed - 11/02/2018 10:56 AM      Failed - Last BP in normal range    BP Readings from Last 1 Encounters:  09/30/18 (!) 182/98         Passed - Cr in normal range and within 180 days    Creat  Date Value Ref Range Status  09/13/2015 0.85 0.70 - 1.33 mg/dL Final    Comment:      For patients > or = 54 years of age: The upper reference limit for Creatinine is approximately 13% higher for people identified as African-American.      Creatinine, Ser  Date Value Ref Range Status  08/31/2018 0.79 0.76 - 1.27 mg/dL Final         Passed - K in normal range and within 180 days    Potassium  Date Value Ref Range Status  08/31/2018 4.8 3.5 - 5.2 mmol/L Final         Passed - Patient is not pregnant      Passed - Valid encounter within last 6 months    Recent Outpatient Visits          1 month ago Screening for colorectal cancer   Primary Care at [place], MD   2 months ago Acute idiopathic gout of right foot   Primary Care at [place], MD   2 months ago Elevated cholesterol   Primary Care at [facility], Rex Kras, MD   3 years ago Essential hypertension   Primary Care at [place] [facility], Ranell Patrick, MD   3 years ago Annual physical exam   Primary Care at [facility], Damaris Hippo, PA-C      Future Appointments            In 4 months Carlota Raspberry Ranell Patrick, MD Primary Care at [place], [facility]

## 2018-12-01 ENCOUNTER — Other Ambulatory Visit: Payer: Self-pay | Admitting: Family Medicine

## 2018-12-01 DIAGNOSIS — I10 Essential (primary) hypertension: Secondary | ICD-10-CM

## 2019-01-04 ENCOUNTER — Telehealth: Payer: Self-pay | Admitting: Family Medicine

## 2019-01-04 NOTE — Telephone Encounter (Signed)
I spoke with patient, he says that he is out of his lisinopril. I told him that Dr. Carlota Raspberry sent a refill on 11/03/2018 for 90 days, but pt states that he called his pharmacy and they only gave him 30 days, but now he is completely out of the medication. His pharmacy told him to contact his PCP to get more refills.

## 2019-01-06 ENCOUNTER — Other Ambulatory Visit: Payer: Self-pay | Admitting: *Deleted

## 2019-01-06 DIAGNOSIS — I10 Essential (primary) hypertension: Secondary | ICD-10-CM

## 2019-01-06 MED ORDER — LISINOPRIL 40 MG PO TABS: ORAL_TABLET | ORAL | 0 refills | Status: DC

## 2019-01-06 NOTE — Telephone Encounter (Signed)
Reordered the prescription.  Confirmed with pharmacy

## 2019-03-02 ENCOUNTER — Other Ambulatory Visit: Payer: Self-pay

## 2019-03-02 DIAGNOSIS — Z20822 Contact with and (suspected) exposure to covid-19: Secondary | ICD-10-CM

## 2019-03-02 DIAGNOSIS — Z20828 Contact with and (suspected) exposure to other viral communicable diseases: Secondary | ICD-10-CM | POA: Diagnosis not present

## 2019-03-03 LAB — NOVEL CORONAVIRUS, NAA: SARS-CoV-2, NAA: DETECTED — AB

## 2019-03-04 ENCOUNTER — Telehealth: Payer: Self-pay | Admitting: Nurse Practitioner

## 2019-03-04 NOTE — Telephone Encounter (Signed)
Called to Discuss with patient about Covid symptoms and the use of bamlanivimab, a monoclonal antibody infusion for those with mild to moderate Covid symptoms and at a high risk of hospitalization.     Pt is qualified for this infusion at the [facility] infusion center due to co-morbid conditions and/or a member of an at-risk group.     Patient Active Problem List   Diagnosis Date Noted  . Diabetes mellitus due to underlying condition with hyperosmolarity without coma, without long-term current use of insulin (HCC) 09/30/2018  . Elevated glucose 09/30/2018  . Frequency of urination 09/30/2018  . Acute idiopathic gout of right foot 08/31/2018  . Acute idiopathic gout of left foot 08/12/2018  . Chest pain 01/11/2016  . Essential hypertension 08/16/2015  . Heart palpitations 08/16/2015  . SOB (shortness of breath) 08/16/2015  . Elevated cholesterol 05/17/2015  . ETOH abuse 05/13/2015    Patient will be over 10 days out before he can get infusion. Symptoms tier reviewed as well as criteria for ending isolation. Preventative practices reviewed. Patient verbalized understanding.    Patient advised to go to Urgent care or ED with severe symptoms.

## 2019-03-30 ENCOUNTER — Ambulatory Visit: Payer: BC Managed Care – PPO | Admitting: Family Medicine

## 2019-04-05 ENCOUNTER — Other Ambulatory Visit: Payer: Self-pay | Admitting: Family Medicine

## 2019-04-05 DIAGNOSIS — I10 Essential (primary) hypertension: Secondary | ICD-10-CM

## 2019-04-05 NOTE — Telephone Encounter (Signed)
Requested medication (s) are due for refill today: yes  Requested medication (s) are on the active medication list: yes  Last refill:  01/06/2019  Future visit scheduled: no  Notes to clinic:  no valid encounter within last 6 months   Requested Prescriptions  Pending Prescriptions Disp Refills   lisinopril (ZESTRIL) 40 MG tablet [Pharmacy Med Name: LISINOPRIL 40MG  TABLETS] 90 tablet 0    Sig: TAKE 1 TABLET(40 MG) BY MOUTH DAILY      Cardiovascular:  ACE Inhibitors Failed - 04/05/2019  3:58 AM      Failed - Cr in normal range and within 180 days    Creat  Date Value Ref Range Status  09/13/2015 0.85 0.70 - 1.33 mg/dL Final    Comment:      For patients > or = 55 years of age: The upper reference limit for Creatinine is approximately 13% higher for people identified as African-American.      Creatinine, Ser  Date Value Ref Range Status  08/31/2018 0.79 0.76 - 1.27 mg/dL Final          Failed - K in normal range and within 180 days    Potassium  Date Value Ref Range Status  08/31/2018 4.8 3.5 - 5.2 mmol/L Final          Failed - Last BP in normal range    BP Readings from Last 1 Encounters:  09/30/18 (!) 182/98          Failed - Valid encounter within last 6 months    Recent Outpatient Visits           6 months ago Screening for colorectal cancer   Primary Care at [facility], [facility], MD   7 months ago Acute idiopathic gout of right foot   Primary Care at [facility], [facility], MD   7 months ago Elevated cholesterol   Primary Care at [facility], [facility], MD   3 years ago Essential hypertension   Primary Care at [facility], Sunday Shams, MD   3 years ago Annual physical exam   Primary Care at [facility], Carmelia Bake, PA-C              Passed - Patient is not pregnant

## 2019-04-05 NOTE — Telephone Encounter (Signed)
Please schedule appt for any further refills

## 2019-05-03 ENCOUNTER — Other Ambulatory Visit: Payer: Self-pay | Admitting: Family Medicine

## 2019-05-03 DIAGNOSIS — I10 Essential (primary) hypertension: Secondary | ICD-10-CM

## 2019-05-03 MED ORDER — METOPROLOL TARTRATE 50 MG PO TABS: 50.0000 mg | ORAL_TABLET | Freq: Two times a day (BID) | ORAL | 0 refills | Status: DC

## 2019-05-03 NOTE — Telephone Encounter (Signed)
Message sent that pt. needs appointment for further refills.

## 2019-05-03 NOTE — Telephone Encounter (Signed)
Medication Refill - Medication: metoprolol tartrate (LOPRESSOR) 50 MG tablet /atorvastatin (LIPITOR) 40 MG tablet     Has the patient contacted their pharmacy? Yes.   (Agent: If no, request that the patient contact the pharmacy for the refill.) (Agent: If yes, when and what did the pharmacy advise?)  Preferred Pharmacy (with phone number or street name): [facility] #[id number] [place], [place] - [address] AT [facility] OF [facility] & [address] Phone:  [phone]  Fax:  [phone]       Agent: Please be advised that RX refills may take up to 3 business days. We ask that you follow-up with your pharmacy.

## 2019-05-10 ENCOUNTER — Other Ambulatory Visit: Payer: Self-pay | Admitting: Family Medicine

## 2019-05-10 DIAGNOSIS — I10 Essential (primary) hypertension: Secondary | ICD-10-CM

## 2019-05-10 MED ORDER — ATORVASTATIN CALCIUM 40 MG PO TABS: ORAL_TABLET | ORAL | 1 refills | Status: DC

## 2019-05-10 MED ORDER — LISINOPRIL 40 MG PO TABS: ORAL_TABLET | ORAL | 0 refills | Status: DC

## 2019-05-10 NOTE — Telephone Encounter (Signed)
Copied from CRM [id number]. Topic: Quick Communication - Rx Refill/Question >> May 10, 2019 10:48 AM Jaquita Rector A wrote: Medication: lisinopril (ZESTRIL) 40 MG tablet, atorvastatin (LIPITOR) 40 MG tablet   Has the patient contacted their pharmacy? Yes.   (Agent: If no, request that the patient contact the pharmacy for the refill.) (Agent: If yes, when and what did the pharmacy advise?)  Preferred Pharmacy (with phone number or street name): [facility] #[id number] [place] Otto, [place] - [address] AT [facility] OF [facility] & [address]  Phone:  [phone] Fax:  [phone]     Agent: Please be advised that RX refills may take up to 3 business days. We ask that you follow-up with your pharmacy.

## 2019-05-10 NOTE — Telephone Encounter (Signed)
Requested Prescriptions  Pending Prescriptions Disp Refills  . lisinopril (ZESTRIL) 40 MG tablet 90 tablet 0    Sig: TAKE 1 TABLET(40 MG) BY MOUTH DAILY     Cardiovascular:  ACE Inhibitors Failed - 05/10/2019 10:55 AM      Failed - Cr in normal range and within 180 days    Creat  Date Value Ref Range Status  09/13/2015 0.85 0.70 - 1.33 mg/dL Final    Comment:      For patients > or = 55 years of age: The upper reference limit for Creatinine is approximately 13% higher for people identified as African-American.      Creatinine, Ser  Date Value Ref Range Status  08/31/2018 0.79 0.76 - 1.27 mg/dL Final         Failed - K in normal range and within 180 days    Potassium  Date Value Ref Range Status  08/31/2018 4.8 3.5 - 5.2 mmol/L Final         Failed - Last BP in normal range    BP Readings from Last 1 Encounters:  09/30/18 (!) 182/98         Failed - Valid encounter within last 6 months    Recent Outpatient Visits          7 months ago Screening for colorectal cancer   Primary Care at [place], MD   8 months ago Acute idiopathic gout of right foot   Primary Care at [place], MD   9 months ago Elevated cholesterol   Primary Care at [facility], Rex Kras, MD   3 years ago Essential hypertension   Primary Care at [place] [facility], Ranell Patrick, MD   3 years ago Annual physical exam   Primary Care at [place], PA-C             Passed - Patient is not pregnant      . atorvastatin (LIPITOR) 40 MG tablet 90 tablet 1    Sig: One per day     Cardiovascular:  Antilipid - Statins Failed - 05/10/2019 10:55 AM      Failed - Total Cholesterol in normal range and within 360 days    Cholesterol, Total  Date Value Ref Range Status  08/31/2018 251 (H) 100 - 199 mg/dL Final         Failed - LDL in normal range and within 360 days    LDL Calculated  Date Value Ref Range Status  08/31/2018 135 (H) 0 - 99 mg/dL Final         Failed -  Triglycerides in normal range and within 360 days    Triglycerides  Date Value Ref Range Status  08/31/2018 263 (H) 0 - 149 mg/dL Final         Passed - HDL in normal range and within 360 days    HDL  Date Value Ref Range Status  08/31/2018 63 >39 mg/dL Final         Passed - Patient is not pregnant      Passed - Valid encounter within last 12 months    Recent Outpatient Visits          7 months ago Screening for colorectal cancer   Primary Care at [place], MD   8 months ago Acute idiopathic gout of right foot   Primary Care at [place], MD   9 months ago Elevated cholesterol  Primary Care at [facility], [facility], MD   3 years ago Essential hypertension   Primary Care at [facility], Asencion Partridge, MD   3 years ago Annual physical exam   Primary Care at [facility], Dema Severin, PA-C

## 2019-05-10 NOTE — Telephone Encounter (Signed)
Requested medication (s) are due for refill today: yes  Requested medication (s) are on the active medication list: yes}  Last refill: 04/05/19  Future visit scheduled: no  Notes to clinic:  no valid encounter within last 6 months   Requested Prescriptions  Pending Prescriptions Disp Refills   lisinopril (ZESTRIL) 40 MG tablet 90 tablet 0    Sig: TAKE 1 TABLET(40 MG) BY MOUTH DAILY      Cardiovascular:  ACE Inhibitors Failed - 05/10/2019 10:55 AM      Failed - Cr in normal range and within 180 days    Creat  Date Value Ref Range Status  09/13/2015 0.85 0.70 - 1.33 mg/dL Final    Comment:      For patients > or = 55 years of age: The upper reference limit for Creatinine is approximately 13% higher for people identified as African-American.      Creatinine, Ser  Date Value Ref Range Status  08/31/2018 0.79 0.76 - 1.27 mg/dL Final          Failed - K in normal range and within 180 days    Potassium  Date Value Ref Range Status  08/31/2018 4.8 3.5 - 5.2 mmol/L Final          Failed - Last BP in normal range    BP Readings from Last 1 Encounters:  09/30/18 (!) 182/98          Failed - Valid encounter within last 6 months    Recent Outpatient Visits           7 months ago Screening for colorectal cancer   Primary Care at [facility], [facility], MD   8 months ago Acute idiopathic gout of right foot   Primary Care at [facility], [facility], MD   9 months ago Elevated cholesterol   Primary Care at [facility], [facility], MD   3 years ago Essential hypertension   [facility] Care at [place] [facility], Asencion Partridge, MD   3 years ago Annual physical exam   Primary Care at [facility], Dema Severin, PA-C              Passed - Patient is not pregnant       Signed Prescriptions Disp Refills   atorvastatin (LIPITOR) 40 MG tablet 90 tablet 1    Sig: One per day      Cardiovascular:  Antilipid - Statins Failed - 05/10/2019 10:55 AM      Failed - Total Cholesterol in normal range and  within 360 days    Cholesterol, Total  Date Value Ref Range Status  08/31/2018 251 (H) 100 - 199 mg/dL Final          Failed - LDL in normal range and within 360 days    LDL Calculated  Date Value Ref Range Status  08/31/2018 135 (H) 0 - 99 mg/dL Final          Failed - Triglycerides in normal range and within 360 days    Triglycerides  Date Value Ref Range Status  08/31/2018 263 (H) 0 - 149 mg/dL Final          Passed - HDL in normal range and within 360 days    HDL  Date Value Ref Range Status  08/31/2018 63 >39 mg/dL Final          Passed - Patient is not pregnant      Passed - Valid encounter within last 12 months  Recent Outpatient Visits           7 months ago Screening for colorectal cancer   Primary Care at [place], MD   8 months ago Acute idiopathic gout of right foot   Primary Care at [place], MD   9 months ago Elevated cholesterol   Primary Care at [facility], Rex Kras, MD   3 years ago Essential hypertension   Primary Care at [facility], Ranell Patrick, MD   3 years ago Annual physical exam   Primary Care at [place], PA-C

## 2019-08-08 ENCOUNTER — Other Ambulatory Visit: Payer: Self-pay | Admitting: Family Medicine

## 2019-08-08 DIAGNOSIS — I10 Essential (primary) hypertension: Secondary | ICD-10-CM

## 2019-11-06 ENCOUNTER — Other Ambulatory Visit: Payer: Self-pay | Admitting: Family Medicine

## 2019-11-06 DIAGNOSIS — I10 Essential (primary) hypertension: Secondary | ICD-10-CM

## 2019-11-06 NOTE — Telephone Encounter (Signed)
Requested medication (s) are due for refill today: no  Requested medication (s) are on the active medication list: yes  Last refill:  08/08/2019  Future visit scheduled: no  Notes to clinic: overdue for 12 month follow up and labs    Requested Prescriptions  Pending Prescriptions Disp Refills   atorvastatin (LIPITOR) 40 MG tablet [Pharmacy Med Name: ATORVASTATIN 40MG  TABLETS] 90 tablet 1    Sig: TAKE 1 TABLET BY MOUTH EVERY DAY      Cardiovascular:  Antilipid - Statins Failed - 11/06/2019  3:44 AM      Failed - Total Cholesterol in normal range and within 360 days    Cholesterol, Total  Date Value Ref Range Status  08/31/2018 251 (H) 100 - 199 mg/dL Final          Failed - LDL in normal range and within 360 days    LDL Calculated  Date Value Ref Range Status  08/31/2018 135 (H) 0 - 99 mg/dL Final          Failed - HDL in normal range and within 360 days    HDL  Date Value Ref Range Status  08/31/2018 63 >39 mg/dL Final          Failed - Triglycerides in normal range and within 360 days    Triglycerides  Date Value Ref Range Status  08/31/2018 263 (H) 0 - 149 mg/dL Final          Failed - Valid encounter within last 12 months    Recent Outpatient Visits           1 year ago Screening for colorectal cancer   Primary Care at [facility], [facility], MD   1 year ago Acute idiopathic gout of right foot   Primary Care at [facility], [facility], MD   1 year ago Elevated cholesterol   Primary Care at [facility], [facility], MD   4 years ago Essential hypertension   [facility] at [facility], Sunday Shams, MD   4 years ago Annual physical exam   Primary Care at [facility], Carmelia Bake, PA-C              Passed - Patient is not pregnant        lisinopril (ZESTRIL) 40 MG tablet [Pharmacy Med Name: LISINOPRIL 40MG  TABLETS] 90 tablet 0    Sig: TAKE 1 TABLET(40 MG) BY MOUTH DAILY      Cardiovascular:  ACE Inhibitors Failed - 11/06/2019  3:44 AM      Failed - Cr in normal  range and within 180 days    Creat  Date Value Ref Range Status  09/13/2015 0.85 0.70 - 1.33 mg/dL Final    Comment:      For patients > or = 55 years of age: The upper reference limit for Creatinine is approximately 13% higher for people identified as African-American.      Creatinine, Ser  Date Value Ref Range Status  08/31/2018 0.79 0.76 - 1.27 mg/dL Final          Failed - K in normal range and within 180 days    Potassium  Date Value Ref Range Status  08/31/2018 4.8 3.5 - 5.2 mmol/L Final          Failed - Last BP in normal range    BP Readings from Last 1 Encounters:  09/30/18 (!) 182/98          Failed - Valid encounter within  last 6 months    Recent Outpatient Visits           1 year ago Screening for colorectal cancer   Primary Care at [facility], [facility], MD   1 year ago Acute idiopathic gout of right foot   Primary Care at [facility], [facility], MD   1 year ago Elevated cholesterol   Primary Care at [facility], [facility], MD   4 years ago Essential hypertension   Primary Care at [facility], [facility], MD   4 years ago Annual physical exam   [facility] at [facility], Dema Severin, PA-C              Passed - Patient is not pregnant

## 2020-01-02 ENCOUNTER — Other Ambulatory Visit: Payer: Self-pay | Admitting: Family Medicine

## 2020-01-02 DIAGNOSIS — I10 Essential (primary) hypertension: Secondary | ICD-10-CM

## 2020-01-02 MED ORDER — ATORVASTATIN CALCIUM 40 MG PO TABS: ORAL_TABLET | ORAL | 1 refills | Status: DC

## 2020-01-02 NOTE — Telephone Encounter (Signed)
Pt has new pt  appt with Kateri Plummer 03/06/2019   And is needing courtesy refill on   What is the name of the medication? atorvastatin (LIPITOR) 40 MG tablet [[id number] And lisinopril (ZESTRIL) 40 MG tablet [[id number]   Have you contacted your pharmacy to request a refill? y  Which pharmacy would you like this sent to? [facility][id number] G[place] Otto, [place] - [phone] AT [facility] OF [facility] & [address]  [address] [place] [address]  Phone:  [phone] Fax:  [phone]  DEA #:  [id number]   Patient notified that their request is being sent to the clinical staff for review and that they should receive a call once it is complete. If they do not receive a call within 72 hours they can check with their pharmacy or our office.

## 2020-01-04 ENCOUNTER — Other Ambulatory Visit: Payer: Self-pay | Admitting: Family Medicine

## 2020-01-04 DIAGNOSIS — I10 Essential (primary) hypertension: Secondary | ICD-10-CM

## 2020-01-04 NOTE — Addendum Note (Signed)
Addended by: Karsten Fells on: 01/04/2020 11:51 AM   Modules accepted: Orders

## 2020-01-04 NOTE — Telephone Encounter (Signed)
Patient checking on the status of lisinopril (ZESTRIL) 40 MG tablet refill request, please advise    [facility] #[id number] Ginette Otto, [place] - [address] AT [facility] OF [facility] & [address] Phone:  [phone]  Fax:  [phone]

## 2020-01-07 ENCOUNTER — Other Ambulatory Visit: Payer: Self-pay | Admitting: Family Medicine

## 2020-01-07 DIAGNOSIS — I10 Essential (primary) hypertension: Secondary | ICD-10-CM

## 2020-01-07 NOTE — Telephone Encounter (Signed)
Requested Prescriptions  Pending Prescriptions Disp Refills  . lisinopril (ZESTRIL) 40 MG tablet [Pharmacy Med Name: LISINOPRIL 40MG  TABLETS] 90 tablet 0    Sig: TAKE 1 TABLET(40 MG) BY MOUTH DAILY     Cardiovascular:  ACE Inhibitors Failed - 01/07/2020 10:44 AM      Failed - Cr in normal range and within 180 days    Creat  Date Value Ref Range Status  09/13/2015 0.85 0.70 - 1.33 mg/dL Final    Comment:      For patients > or = 55 years of age: The upper reference limit for Creatinine is approximately 13% higher for people identified as African-American.      Creatinine, Ser  Date Value Ref Range Status  08/31/2018 0.79 0.76 - 1.27 mg/dL Final         Failed - K in normal range and within 180 days    Potassium  Date Value Ref Range Status  08/31/2018 4.8 3.5 - 5.2 mmol/L Final         Failed - Last BP in normal range    BP Readings from Last 1 Encounters:  09/30/18 (!) 182/98         Failed - Valid encounter within last 6 months    Recent Outpatient Visits          1 year ago Screening for colorectal cancer   Primary Care at [facility], [facility], MD   1 year ago Acute idiopathic gout of right foot   Primary Care at [facility], [facility], MD   1 year ago Elevated cholesterol   Primary Care at [facility], [facility], MD   4 years ago Essential hypertension   [facility] at [facility], Sunday Shams, MD   4 years ago Annual physical exam   Primary Care at [facility], Carm[facility] Bake, PA-C      Future Appointments            In 1 month Dema Severin, NP Primary Care at [place], [facility]           Passed - Patient is not pregnant

## 2020-03-05 ENCOUNTER — Other Ambulatory Visit: Payer: Self-pay

## 2020-03-05 ENCOUNTER — Ambulatory Visit: Payer: BC Managed Care – PPO | Admitting: Registered Nurse

## 2020-03-05 ENCOUNTER — Encounter: Payer: Self-pay | Admitting: Registered Nurse

## 2020-03-05 VITALS — BP 128/76 | HR 89 | Temp 98.3°F | Resp 18 | Ht 65.0 in | Wt 160.8 lb

## 2020-03-05 DIAGNOSIS — I10 Essential (primary) hypertension: Secondary | ICD-10-CM | POA: Diagnosis not present

## 2020-03-05 DIAGNOSIS — Z13228 Encounter for screening for other metabolic disorders: Secondary | ICD-10-CM | POA: Diagnosis not present

## 2020-03-05 DIAGNOSIS — E7800 Pure hypercholesterolemia, unspecified: Secondary | ICD-10-CM

## 2020-03-05 DIAGNOSIS — Z8739 Personal history of other diseases of the musculoskeletal system and connective tissue: Secondary | ICD-10-CM

## 2020-03-05 DIAGNOSIS — Z1329 Encounter for screening for other suspected endocrine disorder: Secondary | ICD-10-CM | POA: Diagnosis not present

## 2020-03-05 DIAGNOSIS — R350 Frequency of micturition: Secondary | ICD-10-CM

## 2020-03-05 DIAGNOSIS — M10071 Idiopathic gout, right ankle and foot: Secondary | ICD-10-CM

## 2020-03-05 DIAGNOSIS — Z130 Encounter for screening for diseases of the blood and blood-forming organs and certain disorders involving the immune mechanism: Secondary | ICD-10-CM | POA: Diagnosis not present

## 2020-03-05 MED ORDER — ATORVASTATIN CALCIUM 40 MG PO TABS
40.0000 mg | ORAL_TABLET | Freq: Every day | ORAL | 3 refills | Status: AC
Start: 2020-03-05 — End: ?

## 2020-03-05 MED ORDER — LISINOPRIL 40 MG PO TABS: 40.0000 mg | ORAL_TABLET | Freq: Every day | ORAL | 3 refills | Status: DC

## 2020-03-05 MED ORDER — METOPROLOL TARTRATE 50 MG PO TABS: 50.0000 mg | ORAL_TABLET | Freq: Two times a day (BID) | ORAL | 3 refills | Status: AC

## 2020-03-05 NOTE — Patient Instructions (Signed)
° ° ° °  If you have lab work done today you will be contacted with your lab results within the next 2 weeks.  If you have not heard from us then please contact us. The fastest way to get your results is to register for My Chart. ° ° °IF you received an x-ray today, you will receive an invoice from [facility]. Please contact [facility] at [phone] with questions or concerns regarding your invoice.  ° °IF you received labwork today, you will receive an invoice from [facility]. Please contact [facility] at [phone] with questions or concerns regarding your invoice.  ° °Our billing staff will not be able to assist you with questions regarding bills from these companies. ° °You will be contacted with the lab results as soon as they are available. The fastest way to get your results is to activate your My Chart account. Instructions are located on the last page of this paperwork. If you have not heard from us regarding the results in 2 weeks, please contact this office. °  ° ° ° °

## 2020-03-05 NOTE — Progress Notes (Signed)
Established Patient Office Visit  Subjective:  Patient ID: Nathan Stout, male    DOB: [birth year]  Age: 55 y.o. MRN: [id number]  CC:  Chief Complaint  Patient presents with  . Urinary Retention    Patient states he is here because he needed a medication refill and also to discuss how whenever he goes to urinate he can not seem to push .    HPI Nathan Stout presents for visit to est care  Histories reviewed and updated with patient  Of note: Hypertension: Patient Currently taking: lisinopril 40mg  PO qd and metoprolol 50mg  PO bid. Good effect. No AEs. Denies CV symptoms including: chest pain, shob, doe, headache, visual changes, fatigue, claudication, and dependent edema.   Previous readings and labs: BP Readings from Last 3 Encounters:  03/05/20 128/76  09/30/18 (!) 182/98  08/31/18 135/87   Lab Results  Component Value Date   CREATININE 0.79 08/31/2018    Hx of Gout No recent flares Has used colchicine and indomethacin in the past Has tried to alter diet to low purines Wants blood work today Would be open to uric acid lowering therapy if he is able  Urinary symptoms Frequency and urgency Nocturia at least 1 episode every night often more No hematuria or dysuria No flank pain No incontinence, post void dribbling, or other symptoms No discharge or new sexual partners Otherwise asymptomatic   Past Medical History:  Diagnosis Date  . Alcohol abuse   . Benign essential HTN 08/16/2015  . Gout   . HLD (hyperlipidemia)     History reviewed. No pertinent surgical history.  Family History  Problem Relation Age of Onset  . Stroke Father   . Gout Brother   . Hypertension Brother     Social History   Socioeconomic History  . Marital status: Married    Spouse name: Not on file  . Number of children: 2  . Years of education: Not on file  . Highest education level: Not on file  Occupational History  . Occupation: 09/02/2018  Tobacco Use  . Smoking status: Former  08/18/2015  . Smokeless tobacco: Former Curator  . Vaping Use: Never used  Substance and Sexual Activity  . Alcohol use: No    Alcohol/week: 0.0 standard drinks  . Drug use: Yes    Comment: 10 beers daily - start drinking after work  . Sexual activity: Yes  Other Topics Concern  . Not on file  Social History Narrative      2 daughter   Work - Games developer   Exercise - none outside of work   From [place] here since 1987   Social Determinants of [place] Strain: Not on 1988 Insecurity: Not on file  Transportation Needs: Not on file  Physical Activity: Not on file  Stress: Not on file  Social Connections: Not on file  Intimate Partner Violence: Not on file    Outpatient Medications Prior to Visit  Medication Sig Dispense Refill  . atorvastatin (LIPITOR) 40 MG tablet One per day 90 tablet 1  . lisinopril (ZESTRIL) 40 MG tablet TAKE 1 TABLET(40 MG) BY MOUTH DAILY 90 tablet 0  . lisinopril (ZESTRIL) 40 MG tablet TAKE 1 TABLET(40 MG) BY MOUTH DAILY 90 tablet 0  . metoprolol tartrate (LOPRESSOR) 50 MG tablet Take 1 tablet (50 mg total) by mouth 2 (two) times daily. 60 tablet 0  . colchicine 0.6 MG tablet Take 2 today with food then 1  tonight with food then 1 po BID until symptoms resolve (Patient not taking: No sig reported) 14 tablet 0  . indomethacin (INDOCIN) 50 MG capsule Take 1 capsule (50 mg total) by mouth 3 (three) times daily as needed for moderate pain. (Patient not taking: Reported on 03/05/2020) 30 capsule 1   No facility-administered medications prior to visit.    No Known Allergies  ROS Review of Systems Per hpi     Objective:    Physical Exam Constitutional:      General: He is not in acute distress.    Appearance: Normal appearance. He is normal weight. He is not ill-appearing, toxic-appearing or diaphoretic.  Cardiovascular:     Rate and Rhythm: Normal rate and regular rhythm.     Heart sounds: Normal heart sounds. No  murmur heard. No friction rub. No gallop.   Pulmonary:     Effort: Pulmonary effort is normal. No respiratory distress.     Breath sounds: Normal breath sounds. No stridor. No wheezing, rhonchi or rales.  Chest:     Chest wall: No tenderness.  Genitourinary:    Comments: Declined by pt Neurological:     General: No focal deficit present.     Mental Status: He is alert and oriented to person, place, and time. Mental status is at baseline.  Psychiatric:        Mood and Affect: Mood normal.        Behavior: Behavior normal.        Thought Content: Thought content normal.        Judgment: Judgment normal.     BP (!) 144/89   Pulse 89   Temp 98.3 F (36.8 C) (Temporal)   Resp 18   Ht 5\' 5"  (1.651 m)   Wt 160 lb 12.8 oz (72.9 kg)   SpO2 97%   BMI 26.76 kg/m  Wt Readings from Last 3 Encounters:  03/05/20 160 lb 12.8 oz (72.9 kg)  09/30/18 157 lb 6.4 oz (71.4 kg)  08/31/18 157 lb (71.2 kg)     Health Maintenance Due  Topic Date Due  . COVID-19 Vaccine (1) Never done  . HEMOGLOBIN A1C  04/02/2019    There are no preventive care reminders to display for this patient.  Lab Results  Component Value Date   TSH 3.240 08/31/2018   Lab Results  Component Value Date   WBC 5.7 05/13/2015   HGB 16.7 05/13/2015   HCT 48.1 05/13/2015   MCV 91.8 05/13/2015   PLT 291 05/13/2015   Lab Results  Component Value Date   NA 136 08/31/2018   K 4.8 08/31/2018   CO2 24 08/31/2018   GLUCOSE 124 (H) 08/31/2018   BUN 15 08/31/2018   CREATININE 0.79 08/31/2018   BILITOT 0.6 08/31/2018   ALKPHOS 79 08/31/2018   AST 53 (H) 08/31/2018   ALT 49 (H) 08/31/2018   PROT 8.5 08/31/2018   ALBUMIN 4.7 08/31/2018   CALCIUM 9.9 08/31/2018   Lab Results  Component Value Date   CHOL 251 (H) 08/31/2018   Lab Results  Component Value Date   HDL 63 08/31/2018   Lab Results  Component Value Date   LDLCALC 135 (H) 08/31/2018   Lab Results  Component Value Date   TRIG 263 (H) 08/31/2018    Lab Results  Component Value Date   CHOLHDL 4.0 08/31/2018   Lab Results  Component Value Date   HGBA1C 6.7 (A) 09/30/2018      Assessment & Plan:  Problem List Items Addressed This Visit      Cardiovascular and Mediastinum   Essential hypertension   Relevant Orders   CBC With Differential   Comprehensive metabolic panel     Musculoskeletal and Integument   Acute idiopathic gout of right foot   Relevant Orders   Uric Acid     Other   Elevated cholesterol   Relevant Orders   Lipid panel   Frequency of urination   Relevant Orders   PSA    Other Visit Diagnoses    History of gout    -  Primary   Relevant Orders   Uric Acid   Screening for endocrine, metabolic and immunity disorder       Relevant Orders   Hemoglobin A1c   TSH      No orders of the defined types were placed in this encounter.   Follow-up: No follow-ups on file.   PLAN  Discussed risk/benefit of PSA. Will proceed. Suspect BPH but if PSA significantly elevated will send to urology, otherwise will consider starting tamsulosin  Refill meds. BP wnl.  Labs collected. Will follow up with the patient as warranted.  Uric acid collected. Consider allopurinol start.  Patient encouraged to call clinic with any questions, comments, or concerns.  Maximiano Coss, NP

## 2020-03-06 ENCOUNTER — Other Ambulatory Visit: Payer: Self-pay | Admitting: Registered Nurse

## 2020-03-06 DIAGNOSIS — Z8739 Personal history of other diseases of the musculoskeletal system and connective tissue: Secondary | ICD-10-CM

## 2020-03-06 LAB — COMPREHENSIVE METABOLIC PANEL
ALT: 45 IU/L — ABNORMAL HIGH (ref 0–44)
AST: 40 IU/L (ref 0–40)
Albumin/Globulin Ratio: 1.3 (ref 1.2–2.2)
Albumin: 4.7 g/dL (ref 3.8–4.9)
Alkaline Phosphatase: 83 IU/L (ref 44–121)
BUN/Creatinine Ratio: 21 — ABNORMAL HIGH (ref 9–20)
BUN: 20 mg/dL (ref 6–24)
Bilirubin Total: 0.3 mg/dL (ref 0.0–1.2)
CO2: 24 mmol/L (ref 20–29)
Calcium: 9.7 mg/dL (ref 8.7–10.2)
Chloride: 98 mmol/L (ref 96–106)
Creatinine, Ser: 0.97 mg/dL (ref 0.76–1.27)
GFR calc Af Amer: 101 mL/min/{1.73_m2} (ref >59)
GFR calc non Af Amer: 88 mL/min/{1.73_m2} (ref >59)
Globulin, Total: 3.5 g/dL (ref 1.5–4.5)
Glucose: 207 mg/dL — ABNORMAL HIGH (ref 65–99)
Potassium: 4.2 mmol/L (ref 3.5–5.2)
Sodium: 138 mmol/L (ref 134–144)
Total Protein: 8.2 g/dL (ref 6.0–8.5)

## 2020-03-06 LAB — CBC WITH DIFFERENTIAL
Basophils Absolute: 0 10*3/uL (ref 0.0–0.2)
Basos: 1 %
EOS (ABSOLUTE): 0.1 10*3/uL (ref 0.0–0.4)
Eos: 1 %
Hematocrit: 42.2 % (ref 37.5–51.0)
Hemoglobin: 14.1 g/dL (ref 13.0–17.7)
Immature Grans (Abs): 0 10*3/uL (ref 0.0–0.1)
Immature Granulocytes: 0 %
Lymphocytes Absolute: 1.6 10*3/uL (ref 0.7–3.1)
Lymphs: 27 %
MCH: 30.7 pg (ref 26.6–33.0)
MCHC: 33.4 g/dL (ref 31.5–35.7)
MCV: 92 fL (ref 79–97)
Monocytes Absolute: 0.6 10*3/uL (ref 0.1–0.9)
Monocytes: 10 %
Neutrophils Absolute: 3.7 10*3/uL (ref 1.4–7.0)
Neutrophils: 61 %
RBC: 4.6 x10E6/uL (ref 4.14–5.80)
RDW: 11.7 % (ref 11.6–15.4)
WBC: 6 10*3/uL (ref 3.4–10.8)

## 2020-03-06 LAB — LIPID PANEL
Chol/HDL Ratio: 3.6 ratio (ref 0.0–5.0)
Cholesterol, Total: 214 mg/dL — ABNORMAL HIGH (ref 100–199)
HDL: 59 mg/dL (ref >39)
LDL Chol Calc (NIH): 111 mg/dL — ABNORMAL HIGH (ref 0–99)
Triglycerides: 258 mg/dL — ABNORMAL HIGH (ref 0–149)
VLDL Cholesterol Cal: 44 mg/dL — ABNORMAL HIGH (ref 5–40)

## 2020-03-06 LAB — HEMOGLOBIN A1C
Est. average glucose Bld gHb Est-mCnc: 148 mg/dL
Hgb A1c MFr Bld: 6.8 % — ABNORMAL HIGH (ref 4.8–5.6)

## 2020-03-06 LAB — URIC ACID: Uric Acid: 9.8 mg/dL — ABNORMAL HIGH (ref 3.8–8.4)

## 2020-03-06 LAB — TSH: TSH: 1.97 u[IU]/mL (ref 0.450–4.500)

## 2020-03-06 LAB — PSA: Prostate Specific Ag, Serum: 0.5 ng/mL (ref 0.0–4.0)

## 2020-03-06 MED ORDER — ALLOPURINOL 100 MG PO TABS: 100.0000 mg | ORAL_TABLET | Freq: Every day | ORAL | 3 refills | Status: DC

## 2020-03-06 NOTE — Progress Notes (Signed)
If we could call patient -   Labs improving overall, but uric acid is high. Instead of mitigare I would like him to start on allopurinol 100mg  daily.  I would like to see him back in 6 mo to recheck on labs  Thanks  Rich

## 2020-05-28 DIAGNOSIS — H524 Presbyopia: Secondary | ICD-10-CM | POA: Diagnosis not present

## 2021-02-09 ENCOUNTER — Other Ambulatory Visit: Payer: Self-pay | Admitting: Registered Nurse

## 2021-02-09 DIAGNOSIS — I10 Essential (primary) hypertension: Secondary | ICD-10-CM

## 2021-02-12 ENCOUNTER — Other Ambulatory Visit: Payer: Self-pay | Admitting: Registered Nurse

## 2021-02-12 DIAGNOSIS — I10 Essential (primary) hypertension: Secondary | ICD-10-CM

## 2021-03-04 ENCOUNTER — Other Ambulatory Visit: Payer: Self-pay | Admitting: Registered Nurse

## 2021-03-04 DIAGNOSIS — Z8739 Personal history of other diseases of the musculoskeletal system and connective tissue: Secondary | ICD-10-CM

## 2021-05-07 ENCOUNTER — Other Ambulatory Visit: Payer: Self-pay | Admitting: Registered Nurse

## 2021-05-07 DIAGNOSIS — I10 Essential (primary) hypertension: Secondary | ICD-10-CM

## 2021-05-14 ENCOUNTER — Other Ambulatory Visit: Payer: Self-pay | Admitting: Registered Nurse

## 2021-05-14 DIAGNOSIS — I10 Essential (primary) hypertension: Secondary | ICD-10-CM
